# Patient Record
Sex: Male | Born: 1955
Health system: Southern US, Community
[De-identification: ages and names within clinical notes are randomized; demographics above are authoritative.]

## PROBLEM LIST (undated history)

## (undated) DIAGNOSIS — F419 Anxiety disorder, unspecified: Secondary | ICD-10-CM

## (undated) DIAGNOSIS — R112 Nausea with vomiting, unspecified: Secondary | ICD-10-CM

## (undated) DIAGNOSIS — K219 Gastro-esophageal reflux disease without esophagitis: Secondary | ICD-10-CM

## (undated) DIAGNOSIS — N390 Urinary tract infection, site not specified: Secondary | ICD-10-CM

## (undated) DIAGNOSIS — Z9889 Other specified postprocedural states: Secondary | ICD-10-CM

## (undated) DIAGNOSIS — M1732 Unilateral post-traumatic osteoarthritis, left knee: Secondary | ICD-10-CM

## (undated) DIAGNOSIS — I10 Essential (primary) hypertension: Secondary | ICD-10-CM

## (undated) HISTORY — PX: KNEE ARTHROSCOPY: SUR90

## (undated) HISTORY — DX: Essential (primary) hypertension: I10

## (undated) HISTORY — PX: EYE SURGERY: SHX253

## (undated) HISTORY — PX: JOINT REPLACEMENT: SHX530

## (undated) HISTORY — DX: Anxiety disorder, unspecified: F41.9

---

## 2014-07-26 ENCOUNTER — Encounter (HOSPITAL_COMMUNITY): Payer: Self-pay | Admitting: Emergency Medicine

## 2014-07-26 ENCOUNTER — Emergency Department (HOSPITAL_COMMUNITY)
Admission: EM | Admit: 2014-07-26 | Discharge: 2014-07-27 | Disposition: A | Discharge status: Home / Self Care | Payer: Self-pay | Attending: Emergency Medicine | Admitting: Emergency Medicine

## 2014-07-26 DIAGNOSIS — N390 Urinary tract infection, site not specified: Secondary | ICD-10-CM | POA: Insufficient documentation

## 2014-07-26 DIAGNOSIS — R7401 Elevation of levels of liver transaminase levels: Secondary | ICD-10-CM

## 2014-07-26 DIAGNOSIS — R740 Nonspecific elevation of levels of transaminase and lactic acid dehydrogenase [LDH]: Secondary | ICD-10-CM | POA: Insufficient documentation

## 2014-07-26 DIAGNOSIS — R030 Elevated blood-pressure reading, without diagnosis of hypertension: Secondary | ICD-10-CM | POA: Insufficient documentation

## 2014-07-26 DIAGNOSIS — IMO0001 Reserved for inherently not codable concepts without codable children: Secondary | ICD-10-CM

## 2014-07-26 LAB — URINALYSIS, ROUTINE W REFLEX MICROSCOPIC
Bilirubin Urine: NEGATIVE
Glucose, UA: NEGATIVE mg/dL
Hgb urine dipstick: NEGATIVE
Ketones, ur: NEGATIVE mg/dL
Nitrite: NEGATIVE
PROTEIN: NEGATIVE mg/dL
Specific Gravity, Urine: 1.006 (ref 1.005–1.030)
Urobilinogen, UA: 0.2 mg/dL (ref 0.0–1.0)
pH: 5.5 (ref 5.0–8.0)

## 2014-07-26 LAB — URINE MICROSCOPIC-ADD ON

## 2014-07-26 LAB — COMPREHENSIVE METABOLIC PANEL
ALBUMIN: 3.8 g/dL (ref 3.5–5.0)
ALT: 70 U/L — ABNORMAL HIGH (ref 17–63)
ANION GAP: 8 (ref 5–15)
AST: 60 U/L — ABNORMAL HIGH (ref 15–41)
Alkaline Phosphatase: 58 U/L (ref 38–126)
BUN: 10 mg/dL (ref 6–20)
CALCIUM: 9 mg/dL (ref 8.9–10.3)
CHLORIDE: 103 mmol/L (ref 101–111)
CO2: 26 mmol/L (ref 22–32)
CREATININE: 0.9 mg/dL (ref 0.61–1.24)
GFR calc Af Amer: >60 mL/min (ref >60)
Glucose, Bld: 136 mg/dL — ABNORMAL HIGH (ref 65–99)
POTASSIUM: 3.9 mmol/L (ref 3.5–5.1)
SODIUM: 137 mmol/L (ref 135–145)
TOTAL PROTEIN: 6.9 g/dL (ref 6.5–8.1)
Total Bilirubin: 0.7 mg/dL (ref 0.3–1.2)

## 2014-07-26 LAB — CBC WITH DIFFERENTIAL/PLATELET
Basophils Absolute: 0 10*3/uL (ref 0.0–0.1)
Basophils Relative: 1 % (ref 0–1)
EOS ABS: 0.5 10*3/uL (ref 0.0–0.7)
EOS PCT: 8 % — AB (ref 0–5)
HEMATOCRIT: 45.1 % (ref 39.0–52.0)
Hemoglobin: 15.3 g/dL (ref 13.0–17.0)
Lymphocytes Relative: 32 % (ref 12–46)
Lymphs Abs: 2.2 10*3/uL (ref 0.7–4.0)
MCH: 32.9 pg (ref 26.0–34.0)
MCHC: 33.9 g/dL (ref 30.0–36.0)
MCV: 97 fL (ref 78.0–100.0)
Monocytes Absolute: 0.5 10*3/uL (ref 0.1–1.0)
Monocytes Relative: 7 % (ref 3–12)
Neutro Abs: 3.7 10*3/uL (ref 1.7–7.7)
Neutrophils Relative %: 52 % (ref 43–77)
Platelets: 198 10*3/uL (ref 150–400)
RBC: 4.65 MIL/uL (ref 4.22–5.81)
RDW: 13.1 % (ref 11.5–15.5)
WBC: 6.9 10*3/uL (ref 4.0–10.5)

## 2014-07-26 LAB — LIPASE, BLOOD: Lipase: 29 U/L (ref 22–51)

## 2014-07-26 NOTE — ED Provider Notes (Signed)
CSN: [id number]     Arrival date & time 07/26/14  1955 History   None    Chief Complaint  Patient presents with  . Dizziness  . Back Pain     (Consider location/radiation/quality/duration/timing/severity/associated sxs/prior Treatment) HPI   Clayborne DanaDennis Bowmer is a 59 y.o. male no significant past medical history complaining of lightheaded sensation when going from sitting to standing, urinary frequency worsening over the course of 2 months. Patient also reports that he developed a low back pain that is positional, rated at 5 out of 10, no pain medication taken prior to arrival starting 3 days ago. Patient states a friend who is a nurse advised him that he may have a urinary tract infection. He used over-the-counter urinary dipstick and he said it came back positive. He started taking Azo 5 days ago, he completed the course 2-3 days ago. Patient denies dysuria, hematuria, polydipsia , h/o IDVU or cancer, numbness or weakness, difficulty ambulating. On review of systems he notes a tactile fever. Patient denies heavy drinking: States he drinks 2 beers per day but he states that he does drink daily.  Patient denies any testicular pain or swelling, rectal pain or pain with defecation.   History reviewed. No pertinent past medical history. Past Surgical History  Procedure Laterality Date  . Knee surgery     No family history on file. History  Substance Use Topics  . Smoking status: Never Smoker   . Smokeless tobacco: Not on file  . Alcohol Use: Yes    Review of Systems  10 systems reviewed and found to be negative, except as noted in the HPI.   Allergies  Review of patient's allergies indicates no known allergies.  Home Medications   Prior to Admission medications   Medication Sig Start Date End Date Taking? Authorizing Provider  ciprofloxacin (CIPRO) 500 MG tablet Take 1 tablet (500 mg total) by mouth every 12 (twelve) hours. 07/27/14   Nyle Limb, PA-C  oxyCODONE (OXY  IR/ROXICODONE) 5 MG immediate release tablet Take 1 tablet (5 mg total) by mouth every 6 (six) hours as needed for severe pain. 07/27/14   Skyy Nilan, PA-C   BP 141/91 mmHg  Pulse 77  Temp(Src) 98.4 F (36.9 C) (Oral)  Resp 14  Ht 5\' 8"  (1.727 m)  Wt 234 lb (106.142 kg)  BMI 35.59 kg/m2  SpO2 97% Physical Exam  Constitutional: He is oriented to person, place, and time. He appears well-developed and well-nourished. No distress.  HENT:  Head: Normocephalic and atraumatic.  Mouth/Throat: Oropharynx is clear and moist.  Eyes: Conjunctivae and EOM are normal. Pupils are equal, round, and reactive to light.  Neck: Normal range of motion.  Cardiovascular: Normal rate, regular rhythm and intact distal pulses.   Pulmonary/Chest: Effort normal and breath sounds normal.  Abdominal: Soft. There is no tenderness.  Genitourinary:  No CVA tenderness to palpation bilaterally  Musculoskeletal: Normal range of motion.  Neurological: He is alert and oriented to person, place, and time.  Skin: He is not diaphoretic.  Psychiatric: He has a normal mood and affect.  Nursing note and vitals reviewed.   ED Course  Procedures (including critical care time) Labs Review Labs Reviewed  CBC WITH DIFFERENTIAL/PLATELET - Abnormal; Notable for the following:    Eosinophils Relative 8 (*)    All other components within normal limits  COMPREHENSIVE METABOLIC PANEL - Abnormal; Notable for the following:    Glucose, Bld 136 (*)    AST 60 (*)  ALT 70 (*)    All other components within normal limits  URINALYSIS, ROUTINE W REFLEX MICROSCOPIC (NOT AT [facility]) - Abnormal; Notable for the following:    Leukocytes, UA SMALL (*)    All other components within normal limits  URINE MICROSCOPIC-ADD ON - Abnormal; Notable for the following:    Bacteria, UA MANY (*)    All other components within normal limits  URINE CULTURE  LIPASE, BLOOD  HEPATITIS PANEL, ACUTE  HIV ANTIBODY (ROUTINE TESTING)  GC/CHLAMYDIA  PROBE AMP ([place]) NOT AT [facility]    Imaging Review No results found.   EKG Interpretation None      MDM   Final diagnoses:  UTI (lower urinary tract infection)  Transaminitis  Elevated blood pressure    Filed Vitals:   07/26/14 2230 07/26/14 2300 07/26/14 2330 07/26/14 2345  BP: 142/78 132/81 143/88 141/91  Pulse: 70 71 76 77  Temp:      TempSrc:      Resp: Height:      Weight:      SpO2: 97% 95% 95% 97%    Medications  lidocaine (PF) (XYLOCAINE) 1 % injection (not administered)  cefTRIAXone (ROCEPHIN) injection 250 mg (250 mg Intramuscular Given 07/27/14 0033)  azithromycin (ZITHROMAX) tablet 1,000 mg (1,000 mg Oral Given 07/27/14 0032)  ciprofloxacin (CIPRO) tablet 500 mg (500 mg Oral Given 07/27/14 0032)    Tracie Lindbloom is a pleasant 59 y.o. male presenting with white headed sensation when going from sitting to standing urinary frequency over the course of 2 months. Patient developed low back pain 2 days ago. Patient is afebrile, no tachycardia, not hypotensive. Blood pressure is elevated in the ED. He does not have primary care follow-up, has not been diagnosed with hypertension does not take any medication regularly. Urinalysis is consistent with infection, no signs of pyelonephritis, epididymitis, prostatitis. Patient states he had unprotected sex 6 months ago. We'll cover for gonorrhea and Chlamydia will treat a complicated UTI with 2 weeks of Cipro. Patient has a transaminitis, hepatitis panel was sent, he understands that I will not follow-up the results to this, he will have to present to the wellness Center to follow-up the results or he can request recordsto follow her up the results. He verbalizes his understanding.  Evaluation does not show pathology that would require ongoing emergent intervention or inpatient treatment. Pt is hemodynamically stable and mentating appropriately. Discussed findings and plan with patient/guardian, who agrees with care  plan. All questions answered. Return precautions discussed and outpatient follow up given.   New Prescriptions   CIPROFLOXACIN (CIPRO) 500 MG TABLET    Take 1 tablet (500 mg total) by mouth every 12 (twelve) hours.   OXYCODONE (OXY IR/ROXICODONE) 5 MG IMMEDIATE RELEASE TABLET    Take 1 tablet (5 mg total) by mouth every 6 (six) hours as needed for severe pain.         Wynetta Emery, PA-C 07/27/14 0131  Mancel Bale, MD 07/28/14 [phone]

## 2014-07-26 NOTE — ED Notes (Signed)
Pt. reports dizziness for 2 months and low back pain onset 3 days ago , denies injury , alert and oriented / respirations unlabored .

## 2014-07-27 LAB — GC/CHLAMYDIA PROBE AMP (~~LOC~~) NOT AT ARMC
Chlamydia: NEGATIVE
NEISSERIA GONORRHEA: NEGATIVE

## 2014-07-27 LAB — HIV ANTIBODY (ROUTINE TESTING W REFLEX): HIV SCREEN 4TH GENERATION: NONREACTIVE

## 2014-07-27 MED ORDER — LIDOCAINE HCL (PF) 1 % IJ SOLN
INTRAMUSCULAR | Status: AC
  Filled 2014-07-27: qty 5

## 2014-07-27 MED ORDER — CEFTRIAXONE SODIUM 250 MG IJ SOLR
250.0000 mg | Freq: Once | INTRAMUSCULAR | Status: AC
  Administered 2014-07-27: 250 mg via INTRAMUSCULAR
  Filled 2014-07-27: qty 250

## 2014-07-27 MED ORDER — AZITHROMYCIN 250 MG PO TABS
1000.0000 mg | ORAL_TABLET | Freq: Once | ORAL | Status: AC
  Administered 2014-07-27: 1000 mg via ORAL
  Filled 2014-07-27: qty 4

## 2014-07-27 MED ORDER — CIPROFLOXACIN HCL 500 MG PO TABS: 500.0000 mg | ORAL_TABLET | Freq: Two times a day (BID) | ORAL | Status: DC

## 2014-07-27 MED ORDER — CIPROFLOXACIN HCL 500 MG PO TABS
500.0000 mg | ORAL_TABLET | Freq: Once | ORAL | Status: AC
  Administered 2014-07-27: 500 mg via ORAL
  Filled 2014-07-27: qty 1

## 2014-07-27 MED ORDER — OXYCODONE HCL 5 MG PO TABS: 5.0000 mg | ORAL_TABLET | Freq: Four times a day (QID) | ORAL | Status: DC | PRN

## 2014-07-27 NOTE — Discharge Instructions (Signed)
Take oxycodone for breakthrough pain, do not drink alcohol, drive, care for children or do other critical tasks while taking oxycodone.  Take your antibiotics as directed and to completion. You should never have any leftover antibiotics! Push fluids and stay well hydrated.   Do not hesitate to return to the Emergency Department for any new, worsening or concerning symptoms.   If you do not have a primary care doctor you can establish one at the   [facility]: [address] [place] [id number] [phone]  After you establish care. Let them know you were seen in the emergency room. They must obtain records for further management.

## 2014-07-28 LAB — HEPATITIS PANEL, ACUTE
HCV Ab: <0.1 s/co ratio — AB (ref 0.0–0.9)
HEP A IGM: NEGATIVE — AB
HEP B S AG: NEGATIVE — AB
Hep B C IgM: NEGATIVE — AB

## 2014-07-29 LAB — URINE CULTURE: Colony Count: >=100000

## 2014-07-30 ENCOUNTER — Telehealth (HOSPITAL_COMMUNITY): Payer: Self-pay

## 2014-07-30 NOTE — Telephone Encounter (Signed)
Post ED Visit - Positive Culture Follow-up  Culture report reviewed by antimicrobial stewardship pharmacist: []  Cody Diaz, Pharm.D., BCPS []  Cody MiyamotoJeremy Diaz, Pharm.D., BCPS []  Cody PillionElizabeth Diaz, Pharm.D., BCPS []  [name], [address]Ph[address]D., BCPS, AAHIVP [x]  Cody HuskMichelle Diaz, Pharm.D., BCPS, AAHIVP []  Cody CyphersLorie Diaz, [address]Pharm[address]D., BCPS  Positive Urine culture, >/= 100,000 colonies -> Klebsiella [name], [name], Cody Diaz 07/30/2014, 7:28 PM

## 2014-08-10 ENCOUNTER — Ambulatory Visit: Payer: Self-pay | Attending: Family Medicine | Admitting: Family Medicine

## 2014-08-10 ENCOUNTER — Encounter: Payer: Self-pay | Admitting: Family Medicine

## 2014-08-10 VITALS — BP 130/89 | HR 69 | Temp 98.2°F | Resp 16 | Ht 68.0 in | Wt 234.0 lb

## 2014-08-10 DIAGNOSIS — R42 Dizziness and giddiness: Secondary | ICD-10-CM

## 2014-08-10 DIAGNOSIS — B961 Klebsiella pneumoniae [K. pneumoniae] as the cause of diseases classified elsewhere: Secondary | ICD-10-CM

## 2014-08-10 DIAGNOSIS — M545 Low back pain, unspecified: Secondary | ICD-10-CM

## 2014-08-10 DIAGNOSIS — R739 Hyperglycemia, unspecified: Secondary | ICD-10-CM

## 2014-08-10 DIAGNOSIS — A498 Other bacterial infections of unspecified site: Secondary | ICD-10-CM | POA: Insufficient documentation

## 2014-08-10 DIAGNOSIS — R300 Dysuria: Secondary | ICD-10-CM

## 2014-08-10 LAB — POCT URINALYSIS DIPSTICK
Bilirubin, UA: NEGATIVE
Glucose, UA: NEGATIVE
KETONES UA: NEGATIVE
Leukocytes, UA: NEGATIVE
Nitrite, UA: NEGATIVE
PROTEIN UA: NEGATIVE
RBC UA: NEGATIVE
SPEC GRAV UA: 1.01
Urobilinogen, UA: 0.2
pH, UA: 6

## 2014-08-10 LAB — POCT GLYCOSYLATED HEMOGLOBIN (HGB A1C): Hemoglobin A1C: 5.9

## 2014-08-10 LAB — GLUCOSE, POCT (MANUAL RESULT ENTRY): POC GLUCOSE: 107 mg/dL — AB (ref 70–99)

## 2014-08-10 MED ORDER — CYCLOBENZAPRINE HCL 10 MG PO TABS: 10.0000 mg | ORAL_TABLET | Freq: Three times a day (TID) | ORAL | Status: DC | PRN

## 2014-08-10 NOTE — Progress Notes (Signed)
   Subjective:    Patient ID: Cody Diaz, male    DOB: [birth year], 59 y.o.   MRN: [id number] CC: ED f/u UTI  HPI  1. UTI: patient with klebsiella UTI. Went ot ED for b/l low back pain. Taking cipro. No fever. Has some chills. Some nausea. No emesis. Stopped percocet due to nausea. Circumcised.   2. Dizziness: x 2 months with standing. Has fatigue. No CP or SOB. Unemployed. Does not exercise. Gets some numbness in fingers. Drinks 2 beers nightly.  Soc hx: non smoker Med Hx: negative for asthma fam hx: DM2 in mother    Review of Systems  Constitutional: Positive for chills and fatigue. Negative for fever.  Respiratory: Negative for shortness of breath.   Cardiovascular: Negative for chest pain, palpitations and leg swelling.  Gastrointestinal: Positive for nausea. Negative for vomiting and abdominal pain.  Endocrine: Negative for polydipsia, polyphagia and polyuria.  Genitourinary: Negative for dysuria, urgency and frequency.  Musculoskeletal: Positive for back pain.  Neurological: Positive for dizziness, light-headedness and numbness. Negative for tremors, seizures, syncope, facial asymmetry, speech difficulty, weakness and headaches.  Psychiatric/Behavioral: Positive for sleep disturbance.       Objective:   Physical Exam BP 130/89 mmHg  Pulse 69  Temp(Src) 98.2 F (36.8 C) (Oral)  Resp 16  Ht 5\' 8"  (1.727 m)  Wt 234 lb (106.142 kg)  BMI 35.59 kg/m2  SpO2 94% General appearance: alert, cooperative, no distress and moderately obese Back: symmetric, no curvature. ROM normal. No CVA tenderness. b/l lumbar paraspinal muscle tenderness.  Lungs: normal WOB  Skin: Skin color, texture, turgor normal. No rashes or lesions       Assessment & Plan:

## 2014-08-10 NOTE — Patient Instructions (Addendum)
Cody Diaz,  Thank you for coming in today   1. Klebsiella UTI still with low back pains: Finish cipro Add muscle relaxer, flexeril  Repeat UA is normal Sending out repeat urine culture   2. Dizziness:  Checking blood sugar Continue to drink plenty of water    Chemistry      Component Value Date/Time   NA 137 07/26/2014 2009   K 3.9 07/26/2014 2009   CL 103 07/26/2014 2009   CO2 26 07/26/2014 2009   BUN 10 07/26/2014 2009   CREATININE 0.90 07/26/2014 2009      Component Value Date/Time   CALCIUM 9.0 07/26/2014 2009   ALKPHOS 58 07/26/2014 2009   AST 60* 07/26/2014 2009   ALT 70* 07/26/2014 2009   BILITOT 0.7 07/26/2014 2009     F/u in 6 weeks for dizziness  Dr. Armen Pickup

## 2014-08-10 NOTE — Assessment & Plan Note (Signed)
A; low back pain suspect MSK pain P: muscle relaxer

## 2014-08-10 NOTE — Assessment & Plan Note (Signed)
A; klebsiella UTI sensitive to cipro. Repeat UA wnl today P: Repeat urine culture Pt to finish cipro Plan for renal US once patient has orange card

## 2014-08-10 NOTE — Assessment & Plan Note (Signed)
A: noted in ED. Mom with DM2. A1c elevated but not diabetes P: Encouraged exercise and low carb diet

## 2014-08-10 NOTE — Assessment & Plan Note (Signed)
A; sound like ortho stasis P: Pateint to f/u for orthostatic VS at next OV Increase fluids today

## 2014-08-10 NOTE — Progress Notes (Signed)
Establish care  HFU UTI

## 2014-08-11 LAB — URINE CULTURE
Colony Count: NO GROWTH
ORGANISM ID, BACTERIA: NO GROWTH

## 2014-08-15 ENCOUNTER — Telehealth: Payer: Self-pay | Admitting: *Deleted

## 2014-08-15 NOTE — Telephone Encounter (Signed)
Pt aware of results 

## 2014-08-15 NOTE — Telephone Encounter (Signed)
-----   Message from Dessa Phi, MD sent at 08/14/2014  8:56 AM EDT ----- Repeat urine culture negative

## 2015-05-01 ENCOUNTER — Encounter: Payer: Self-pay | Admitting: Family Medicine

## 2015-05-01 ENCOUNTER — Ambulatory Visit: Payer: Self-pay | Attending: Family Medicine | Admitting: Family Medicine

## 2015-05-01 ENCOUNTER — Encounter: Payer: Self-pay | Admitting: Clinical

## 2015-05-01 VITALS — BP 145/85 | HR 72 | Temp 98.0°F | Resp 16 | Ht 67.5 in | Wt 246.0 lb

## 2015-05-01 DIAGNOSIS — R42 Dizziness and giddiness: Secondary | ICD-10-CM

## 2015-05-01 DIAGNOSIS — M545 Low back pain, unspecified: Secondary | ICD-10-CM

## 2015-05-01 DIAGNOSIS — Z79899 Other long term (current) drug therapy: Secondary | ICD-10-CM | POA: Insufficient documentation

## 2015-05-01 DIAGNOSIS — Z636 Dependent relative needing care at home: Secondary | ICD-10-CM

## 2015-05-01 DIAGNOSIS — R7303 Prediabetes: Secondary | ICD-10-CM

## 2015-05-01 DIAGNOSIS — N390 Urinary tract infection, site not specified: Secondary | ICD-10-CM

## 2015-05-01 DIAGNOSIS — K0530 Chronic periodontitis, unspecified: Secondary | ICD-10-CM

## 2015-05-01 DIAGNOSIS — E559 Vitamin D deficiency, unspecified: Secondary | ICD-10-CM

## 2015-05-01 DIAGNOSIS — I10 Essential (primary) hypertension: Secondary | ICD-10-CM

## 2015-05-01 DIAGNOSIS — M549 Dorsalgia, unspecified: Secondary | ICD-10-CM | POA: Insufficient documentation

## 2015-05-01 DIAGNOSIS — F411 Generalized anxiety disorder: Secondary | ICD-10-CM

## 2015-05-01 LAB — COMPLETE METABOLIC PANEL WITH GFR
ALT: 107 U/L — ABNORMAL HIGH (ref 9–46)
AST: 89 U/L — AB (ref 10–35)
Albumin: 4.5 g/dL (ref 3.6–5.1)
Alkaline Phosphatase: 70 U/L (ref 40–115)
BUN: 14 mg/dL (ref 7–25)
CALCIUM: 9.6 mg/dL (ref 8.6–10.3)
CHLORIDE: 100 mmol/L (ref 98–110)
CO2: 25 mmol/L (ref 20–31)
Creat: 0.92 mg/dL (ref 0.70–1.33)
GFR, Est African American: >89 mL/min (ref >=60)
GFR, Est Non African American: >89 mL/min (ref >=60)
GLUCOSE: 105 mg/dL — AB (ref 65–99)
POTASSIUM: 4.4 mmol/L (ref 3.5–5.3)
SODIUM: 140 mmol/L (ref 135–146)
Total Bilirubin: 0.8 mg/dL (ref 0.2–1.2)
Total Protein: 7.8 g/dL (ref 6.1–8.1)

## 2015-05-01 LAB — POCT URINALYSIS DIPSTICK
BILIRUBIN UA: NEGATIVE
Glucose, UA: NEGATIVE
Ketones, UA: NEGATIVE
NITRITE UA: NEGATIVE
PH UA: 5.5
Protein, UA: NEGATIVE
RBC UA: NEGATIVE
Spec Grav, UA: 1.015
UROBILINOGEN UA: 0.2

## 2015-05-01 LAB — CBC
HCT: 45.9 % (ref 39.0–52.0)
Hemoglobin: 16.1 g/dL (ref 13.0–17.0)
MCH: 33.8 pg (ref 26.0–34.0)
MCHC: 35.1 g/dL (ref 30.0–36.0)
MCV: 96.2 fL (ref 78.0–100.0)
MPV: 10.5 fL (ref 8.6–12.4)
PLATELETS: 176 10*3/uL (ref 150–400)
RBC: 4.77 MIL/uL (ref 4.22–5.81)
RDW: 13.5 % (ref 11.5–15.5)
WBC: 8 10*3/uL (ref 4.0–10.5)

## 2015-05-01 LAB — GLUCOSE, POCT (MANUAL RESULT ENTRY): POC GLUCOSE: 112 mg/dL — AB (ref 70–99)

## 2015-05-01 LAB — TSH: TSH: 1.99 mIU/L (ref 0.40–4.50)

## 2015-05-01 LAB — POCT GLYCOSYLATED HEMOGLOBIN (HGB A1C): HEMOGLOBIN A1C: 6.4

## 2015-05-01 MED ORDER — DIAZEPAM 5 MG PO TABS: 5.0000 mg | ORAL_TABLET | Freq: Four times a day (QID) | ORAL | Status: DC | PRN

## 2015-05-01 MED ORDER — ESCITALOPRAM OXALATE 10 MG PO TABS: 10.0000 mg | ORAL_TABLET | Freq: Every day | ORAL | Status: DC

## 2015-05-01 MED ORDER — CHLORHEXIDINE GLUCONATE 0.12 % MT SOLN: 15.0000 mL | Freq: Two times a day (BID) | OROMUCOSAL | Status: DC

## 2015-05-01 MED ORDER — CIPROFLOXACIN HCL 500 MG PO TABS: 500.0000 mg | ORAL_TABLET | Freq: Two times a day (BID) | ORAL | Status: DC

## 2015-05-01 MED FILL — CHLORHEXIDINE 0.12% RINSE: 0.12 | 30 days supply | Qty: 473 | Fill #0

## 2015-05-01 MED FILL — diazePAM 5 MG TABS: 5 | 15 days supply | Qty: 60 | Fill #0

## 2015-05-01 MED FILL — ?CIPROFLOXACIN HCL 500MG TA: 500 | 7 days supply | Qty: 14 | Fill #0

## 2015-05-01 MED FILL — ?ESCITALOPRAM 10 MG TABLET: 10 | 30 days supply | Qty: 30 | Fill #0

## 2015-05-01 NOTE — Assessment & Plan Note (Signed)
A: prediabetes  P: Plan to start metformin at f.u

## 2015-05-01 NOTE — Assessment & Plan Note (Signed)
A; GAD with caregiver stress P: lexapro Valium

## 2015-05-01 NOTE — Assessment & Plan Note (Signed)
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

## 2015-05-01 NOTE — Assessment & Plan Note (Signed)
A: UA suggest UTI P: cipro Urine cx

## 2015-05-01 NOTE — Progress Notes (Signed)
Low back pain frequency urination x 3 month Anxiety due to mother been sick  Pain today #8 No tobacco user  No suicidal thoughts in the past two weeks

## 2015-05-01 NOTE — Assessment & Plan Note (Signed)
A; chronic low back pain P: Valium muscle relaxer

## 2015-05-01 NOTE — Progress Notes (Signed)
[facility] 2/9 05/01/2015 08/10/2014  Decreased Interest 3 0  Down, Depressed, Hopeless 3 0  PHQ - 2 Score 6 0  Altered sleeping 0 -  Tired, decreased energy 0 -  Change in appetite 2 -  Feeling bad or failure about yourself  0 -  Trouble concentrating 3 -  Moving slowly or fidgety/restless 3 -  Suicidal thoughts 0 -  PHQ-9 Score 14 -    GAD 7 : Generalized Anxiety Score 05/01/2015  Nervous, Anxious, on Edge 3  Control/stop worrying 3  Worry too much - different things 3  Trouble relaxing 3  Restless 3  Easily annoyed or irritable 3  Afraid - awful might happen 3  Total GAD 7 Score 21

## 2015-05-01 NOTE — Assessment & Plan Note (Signed)
A: elevated BP with hx of labile BPs P: Close f/u Likely start ARB

## 2015-05-01 NOTE — Progress Notes (Signed)
Subjective:  Patient ID: Cody Diaz, male    DOB: September 11, 1955  Age: 60 y.o. MRN: [id number]  CC: Back Pain and Anxiety   HPI Cody Diaz presents for   1. Back pain: x one year. Bilateral low back. Non-radiating. Worse on L side. He also endorses 3 months of urinary frequency. No hematuria, no dysuria. No recent trauma. No fever or chills.   2. Anxiety: x 6 months. 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 He lives with his mom. He is unemployed. His sister comes over to help 2-3 times a week. His mom refuses home health/SNF. Once while he was working, his mother fell at home and was down for 3 hrs. After that he quit his job.  He has daily anxiety. He has poor sleep, tremor, dizziness, lightheadedness, crying. He has a hx of alcohol overuse. He last drank about 3 weeks in an attempt to numb a severe tooth ache.   3. Poor dentition: he has multiples carries with cracked and broken molars. He denies tooth pain now but had some recently. He has not applied for the orange card/[place] discount due to worries [place].   Social History  Substance Use Topics  . Smoking status: Never Smoker   . Smokeless tobacco: Never Used  . Alcohol Use: 1.2 oz/week    2 Cans of beer per week     Comment: 2 beers per night    Outpatient Prescriptions Prior to Visit  Medication Sig Dispense Refill  . cyclobenzaprine (FLEXERIL) 10 MG tablet Take 1 tablet (10 mg total) by mouth 3 (three) times daily as needed for muscle spasms. (Patient not taking: Reported on 05/01/2015) 30 tablet 0  . ciprofloxacin (CIPRO) 500 MG tablet Take 1 tablet (500 mg total) by mouth every 12 (twelve) hours. 28 tablet 0   No facility-administered medications prior to visit.    ROS Review of Systems  Constitutional: Negative for fever, chills, fatigue and unexpected weight change.  Eyes: Negative for visual disturbance.    Respiratory: Negative for cough and shortness of breath.   Cardiovascular: Negative for chest pain, palpitations and leg swelling.  Gastrointestinal: Negative for nausea, vomiting, abdominal pain, diarrhea, constipation and blood in stool.  Endocrine: Negative for polydipsia, polyphagia and polyuria.  Musculoskeletal: Positive for back pain. Negative for myalgias, arthralgias, gait problem and neck pain.  Skin: Negative for rash.  Allergic/Immunologic: Negative for immunocompromised state.  Neurological: Positive for dizziness, light-headedness and numbness (in 3-5th fingers on R hand ).  Hematological: Negative for adenopathy. Does not bruise/bleed easily.  Psychiatric/Behavioral: Positive for sleep disturbance. Negative for suicidal ideas and dysphoric mood. The patient is nervous/anxious.     Objective:  BP 145/85 mmHg  Pulse 72  Temp(Src) 98 F (36.7 C) (Oral)  Resp 16  Ht 5' 7.5" (1.715 m)  Wt 246 lb (111.585 kg)  BMI 37.94 kg/m2  SpO2 96%  BP/Weight 05/01/2015 08/10/2014 07/27/2014  Systolic BP 145 130 159  Diastolic BP 85 89 92  Wt. (Lbs) 246 234 -  BMI 37.94 35.59 -    Physical Exam  Constitutional: He appears well-developed and well-nourished. No distress.  HENT:  Head: Normocephalic and atraumatic.  Mouth/Throat: Oropharynx is clear and moist and mucous membranes are normal. Abnormal dentition. Dental caries present.  Neck: Normal range of motion. Neck supple.  Cardiovascular: Normal rate, regular rhythm, normal heart sounds and intact distal pulses.  Pulmonary/Chest: Effort normal and breath sounds normal.  Musculoskeletal: He exhibits no edema.       Lumbar back: He exhibits decreased range of motion, tenderness, pain and spasm. He exhibits no bony tenderness.  B/l lumbar paraspinal muscle tenderness   Neurological: He is alert.  Skin: Skin is warm and dry. No rash noted. No erythema.  Psychiatric: He has a normal mood and affect.   GAD 7 : Generalized Anxiety  Score 05/01/2015  Nervous, Anxious, on Edge 3  Control/stop worrying 3  Worry too much - different things 3  Trouble relaxing 3  Restless 3  Easily annoyed or irritable 3  Afraid - awful might happen 3  Total GAD 7 Score 21    Depression screen [facility] 2/9 05/01/2015 08/10/2014  Decreased Interest 3 0  Down, Depressed, Hopeless 3 0  PHQ - 2 Score 6 0  Altered sleeping 0 -  Tired, decreased energy 0 -  Change in appetite 2 -  Feeling bad or failure about yourself  0 -  Trouble concentrating 3 -  Moving slowly or fidgety/restless 3 -  Suicidal thoughts 0 -  PHQ-9 Score 14 -   UA: large LE, otherwise negative  CBG: 112  Lab Results  Component Value Date   HGBA1C 5.90 08/10/2014   Lab Results  Component Value Date   HGBA1C 6.4 05/01/2015     Assessment & Plan:   Cody Diaz was seen today for back pain and anxiety.  Diagnoses and all orders for this visit:  Lightheadedness -     CBC -     COMPLETE METABOLIC PANEL WITH GFR -     Vitamin D, 25-hydroxy -     TSH -     Glucose (CBG) -     HgB A1c  Bilateral low back pain without sciatica -     POCT urinalysis dipstick -     diazepam (VALIUM) 5 MG tablet; Take 1 tablet (5 mg total) by mouth every 6 (six) hours as needed for anxiety.  Essential hypertension  Prediabetes  Generalized anxiety disorder -     escitalopram (LEXAPRO) 10 MG tablet; Take 1 tablet (10 mg total) by mouth daily. -     diazepam (VALIUM) 5 MG tablet; Take 1 tablet (5 mg total) by mouth every 6 (six) hours as needed for anxiety.  Caregiver stress  Urinary tract infection, site not specified -     Urine culture -     ciprofloxacin (CIPRO) 500 MG tablet; Take 1 tablet (500 mg total) by mouth 2 (two) times daily.  Chronic periodontitis -     Discontinue: chlorhexidine (PERIDEX) 0.12 % solution; Use as directed 15 mLs in the mouth or throat 2 (two) times daily. Swish for 30 seconds then spit twice daily after brushing -     chlorhexidine (PERIDEX) 0.12 %  solution; Use as directed 15 mLs in the mouth or throat 2 (two) times daily. Swish for 30 seconds then spit twice daily after brushing    No orders of the defined types were placed in this encounter.    Follow-up: No Follow-up on file.   Dessa Phi MD

## 2015-05-01 NOTE — Patient Instructions (Addendum)
Cody Diaz was seen today for back pain and anxiety.  Diagnoses and all orders for this visit:  Lightheadedness -     CBC -     COMPLETE METABOLIC PANEL WITH GFR -     Vitamin D, 25-hydroxy -     TSH -     Glucose (CBG) -     HgB A1c  Bilateral low back pain without sciatica -     POCT urinalysis dipstick -     diazepam (VALIUM) 5 MG tablet; Take 1 tablet (5 mg total) by mouth every 6 (six) hours as needed for anxiety.  Essential hypertension  Prediabetes  Generalized anxiety disorder -     escitalopram (LEXAPRO) 10 MG tablet; Take 1 tablet (10 mg total) by mouth daily. -     diazepam (VALIUM) 5 MG tablet; Take 1 tablet (5 mg total) by mouth every 6 (six) hours as needed for anxiety.  Caregiver stress  Urinary tract infection, site not specified -     Urine culture -     ciprofloxacin (CIPRO) 500 MG tablet; Take 1 tablet (500 mg total) by mouth 2 (two) times daily.  Chronic periodontitis -     chlorhexidine (PERIDEX) 0.12 % solution; Use as directed 15 mLs in the mouth or throat 2 (two) times daily. Swish for 30 seconds then spit twice daily after brushing   F/u in 4 weeks for blood sugar check and BP check  Dr. Armen PickupFunches

## 2015-05-02 ENCOUNTER — Telehealth: Payer: Self-pay | Admitting: *Deleted

## 2015-05-02 DIAGNOSIS — E559 Vitamin D deficiency, unspecified: Secondary | ICD-10-CM | POA: Insufficient documentation

## 2015-05-02 LAB — VITAMIN D 25 HYDROXY (VIT D DEFICIENCY, FRACTURES): Vit D, 25-Hydroxy: 5 ng/mL — ABNORMAL LOW (ref 30–100)

## 2015-05-02 MED ORDER — VITAMIN D (ERGOCALCIFEROL) 1.25 MG (50000 UNIT) PO CAPS: 50000.0000 [IU] | ORAL_CAPSULE | ORAL | Status: DC

## 2015-05-02 NOTE — Telephone Encounter (Signed)
Date of birth verified by pt  Lab results given Rx send to [facility] pharmacy  Avoid ETOH  Pt verbalized understanding

## 2015-05-02 NOTE — Telephone Encounter (Signed)
-----   Message from Dessa PhiJosalyn Funches, MD sent at 05/02/2015  2:50 PM EST ----- Labs normal except for elevated liver enzymes and vit D deficiency Vit D ordered  Avoid alcohol

## 2015-05-02 NOTE — Addendum Note (Signed)
Addended by: Dessa PhiFUNCHES, Parth Mccormac on: 05/02/2015 02:51 PM   Modules accepted: Orders

## 2015-05-29 ENCOUNTER — Ambulatory Visit: Payer: Self-pay | Admitting: Family Medicine

## 2015-06-29 ENCOUNTER — Ambulatory Visit: Payer: Self-pay | Admitting: Family Medicine

## 2015-07-16 ENCOUNTER — Ambulatory Visit: Payer: Self-pay | Admitting: Family Medicine

## 2015-07-26 ENCOUNTER — Encounter: Payer: Self-pay | Admitting: Family Medicine

## 2015-07-26 ENCOUNTER — Ambulatory Visit: Payer: Self-pay | Attending: Family Medicine | Admitting: Family Medicine

## 2015-07-26 VITALS — BP 129/91 | HR 90 | Temp 98.2°F | Resp 16 | Ht 68.0 in | Wt 254.0 lb

## 2015-07-26 DIAGNOSIS — F411 Generalized anxiety disorder: Secondary | ICD-10-CM

## 2015-07-26 DIAGNOSIS — M545 Low back pain, unspecified: Secondary | ICD-10-CM

## 2015-07-26 DIAGNOSIS — Z79899 Other long term (current) drug therapy: Secondary | ICD-10-CM | POA: Insufficient documentation

## 2015-07-26 DIAGNOSIS — M25562 Pain in left knee: Secondary | ICD-10-CM

## 2015-07-26 DIAGNOSIS — E119 Type 2 diabetes mellitus without complications: Secondary | ICD-10-CM

## 2015-07-26 LAB — POCT GLYCOSYLATED HEMOGLOBIN (HGB A1C): HEMOGLOBIN A1C: 6.5

## 2015-07-26 MED ORDER — NAPROXEN 500 MG PO TABS: 500.0000 mg | ORAL_TABLET | Freq: Two times a day (BID) | ORAL | Status: DC

## 2015-07-26 MED ORDER — METFORMIN HCL ER 500 MG PO TB24: 1000.0000 mg | ORAL_TABLET | Freq: Every day | ORAL | Status: DC

## 2015-07-26 MED ORDER — ESCITALOPRAM OXALATE 10 MG PO TABS: 10.0000 mg | ORAL_TABLET | Freq: Every day | ORAL | Status: DC

## 2015-07-26 MED ORDER — DIAZEPAM 5 MG PO TABS: 5.0000 mg | ORAL_TABLET | Freq: Three times a day (TID) | ORAL | Status: DC | PRN

## 2015-07-26 NOTE — Patient Instructions (Addendum)
[name] was seen today for diabetes.  Diagnoses and all orders for this visit:  Lateral knee pain, left -     naproxen (NAPROSYN) 500 MG tablet; Take 1 tablet (500 mg total) by mouth 2 (two) times daily with a meal. -     DG Knee AP/LAT W/Sunrise Left; Future  Generalized anxiety disorder -     escitalopram (LEXAPRO) 10 MG tablet; Take 1 tablet (10 mg total) by mouth daily. -     diazepam (VALIUM) 5 MG tablet; Take 1 tablet (5 mg total) by mouth every 8 (eight) hours as needed for anxiety.  Bilateral low back pain without sciatica -     diazepam (VALIUM) 5 MG tablet; Take 1 tablet (5 mg total) by mouth every 8 (eight) hours as needed for anxiety.  New onset type 2 diabetes mellitus (HCC) -     HgB A1c -     metFORMIN (GLUCOPHAGE XR) 500 MG 24 hr tablet; Take 2 tablets (1,000 mg total) by mouth daily after supper.   For the first week, take 500 mg of metformin XR after supper, then increase to 1000 mg after supper.    F/u in 6 weeks for diabetes and L knee pain   Dr. Armen PickupFunches

## 2015-07-26 NOTE — Progress Notes (Signed)
Subjective:  Patient ID: Cody Diaz, male    DOB: 1955/08/09  Age: 60 y.o. MRN: [id number]  CC: Diabetes   HPI Cody Diaz presents for   1. Diabetes: he was pre-diabetic at last OV. He drinks vodka nightly due to stress. He cares for his elderly mother. He does not exercise. He is gaining weight.  2. Stress: daily. Trouble sleep. Not taking lexpro or valium as he ran out of both. No SI. Unemployed.  3. L knee pain: x 3 weeks. Fell onto his L knee while attempting to brace his mother's fall. Lateral pain. Mild swelling. No redness. Has hx of R knee injury s/p scope.  Social History  Substance Use Topics  . Smoking status: Never Smoker   . Smokeless tobacco: Never Used  . Alcohol Use: 1.2 oz/week    2 Cans of beer per week     Comment: 2 beers per night     Outpatient Prescriptions Prior to Visit  Medication Sig Dispense Refill  . chlorhexidine (PERIDEX) 0.12 % solution Use as directed 15 mLs in the mouth or throat 2 (two) times daily. Swish for 30 seconds then spit twice daily after brushing 473 mL 0  . ciprofloxacin (CIPRO) 500 MG tablet Take 1 tablet (500 mg total) by mouth 2 (two) times daily. 14 tablet 0  . cyclobenzaprine (FLEXERIL) 10 MG tablet Take 1 tablet (10 mg total) by mouth 3 (three) times daily as needed for muscle spasms. (Patient not taking: Reported on 05/01/2015) 30 tablet 0  . diazepam (VALIUM) 5 MG tablet Take 1 tablet (5 mg total) by mouth every 6 (six) hours as needed for anxiety. 60 tablet 0  . escitalopram (LEXAPRO) 10 MG tablet Take 1 tablet (10 mg total) by mouth daily. 30 tablet 0  . Vitamin D, Ergocalciferol, (DRISDOL) 50000 units CAPS capsule Take 1 capsule (50,000 Units total) by mouth every 7 (seven) days. For 8 weeks 8 capsule 0   No facility-administered medications prior to visit.    ROS Review of Systems  Constitutional: Negative for fever, chills, fatigue and unexpected weight change.  Eyes: Negative for visual disturbance.    Respiratory: Negative for cough and shortness of breath.   Cardiovascular: Negative for chest pain, palpitations and leg swelling.  Gastrointestinal: Negative for nausea, vomiting, abdominal pain, diarrhea, constipation and blood in stool.  Endocrine: Negative for polydipsia, polyphagia and polyuria.  Musculoskeletal: Positive for arthralgias. Negative for myalgias, back pain, gait problem and neck pain.  Skin: Negative for rash.  Allergic/Immunologic: Negative for immunocompromised state.  Hematological: Negative for adenopathy. Does not bruise/bleed easily.  Psychiatric/Behavioral: Positive for sleep disturbance. Negative for suicidal ideas and dysphoric mood. The patient is nervous/anxious.     Objective:  BP 129/91 mmHg  Pulse 90  Temp(Src) 98.2 F (36.8 C) (Oral)  Resp 16  Ht 5\' 8"  (1.727 m)  Wt 254 lb (115.214 kg)  BMI 38.63 kg/m2  SpO2 96%  BP/Weight 07/26/2015 05/01/2015 08/10/2014  Systolic BP 129 145 130  Diastolic BP 91 85 89  Wt. (Lbs) 254 246 234  BMI 38.63 37.94 35.59    Physical Exam  Constitutional: He appears well-developed and well-nourished. No distress.  Obese   HENT:  Head: Normocephalic and atraumatic.  Neck: Normal range of motion. Neck supple.  Cardiovascular: Normal rate, regular rhythm, normal heart sounds and intact distal pulses.   Pulmonary/Chest: Effort normal and breath sounds normal.  Musculoskeletal: He exhibits no edema.       Left knee: He  exhibits decreased range of motion, swelling, effusion and abnormal patellar mobility. He exhibits no ecchymosis, no deformity, no laceration, no erythema, normal alignment and no LCL laxity. Tenderness found. Lateral joint line tenderness noted.  Neurological: He is alert.  Skin: Skin is warm and dry. No rash noted. No erythema.  Psychiatric: He has a normal mood and affect.    Lab Results  Component Value Date   HGBA1C 6.4 05/01/2015    Lab Results  Component Value Date   HGBA1C 6.5 07/26/2015    Depression screen Cody Diaz 2/9 07/26/2015 05/01/2015 08/10/2014  Decreased Interest 3 3 0  Down, Depressed, Hopeless 2 3 0  PHQ - 2 Score 5 6 0  Altered sleeping 3 0 -  Tired, decreased energy 3 0 -  Change in appetite 3 2 -  Feeling bad or failure about yourself  3 0 -  Trouble concentrating 1 3 -  Moving slowly or fidgety/restless 3 3 -  Suicidal thoughts 1 0 -  PHQ-9 Score 22 14 -    GAD 7 : Generalized Anxiety Score 07/26/2015 05/01/2015  Nervous, Anxious, on Edge 3 3  Control/stop worrying 3 3  Worry too much - different things 3 3  Trouble relaxing 3 3  Restless 3 3  Easily annoyed or irritable 2 3  Afraid - awful might happen 3 3  Total GAD 7 Score 20 21     Assessment & Plan:   Cody Diaz was seen today for diabetes.  Diagnoses and all orders for this visit:  Lateral knee pain, left -     naproxen (NAPROSYN) 500 MG tablet; Take 1 tablet (500 mg total) by mouth 2 (two) times daily with a meal. -     DG Knee AP/LAT W/Sunrise Left; Future  Generalized anxiety disorder -     escitalopram (LEXAPRO) 10 MG tablet; Take 1 tablet (10 mg total) by mouth daily. -     diazepam (VALIUM) 5 MG tablet; Take 1 tablet (5 mg total) by mouth every 8 (eight) hours as needed for anxiety.  Bilateral low back pain without sciatica -     diazepam (VALIUM) 5 MG tablet; Take 1 tablet (5 mg total) by mouth every 8 (eight) hours as needed for anxiety.  New onset type 2 diabetes mellitus (HCC) -     HgB A1c -     metFORMIN (GLUCOPHAGE XR) 500 MG 24 hr tablet; Take 2 tablets (1,000 mg total) by mouth daily after supper.    No orders of the defined types were placed in this encounter.    Follow-up: No Follow-up on file.   Cody Phi MD

## 2015-07-26 NOTE — Progress Notes (Signed)
F/U DM  Glucose running 105 116 Rt knee pain due to injury  Pain scale # 6 No tobacco user  No suicidal thoughts in the past two weeks

## 2015-07-27 DIAGNOSIS — E119 Type 2 diabetes mellitus without complications: Secondary | ICD-10-CM | POA: Insufficient documentation

## 2015-07-27 DIAGNOSIS — M25561 Pain in right knee: Secondary | ICD-10-CM | POA: Insufficient documentation

## 2015-07-27 DIAGNOSIS — M25562 Pain in left knee: Secondary | ICD-10-CM

## 2015-07-27 MED FILL — diazePAM 5 MG TABS: 5 | 20 days supply | Qty: 60 | Fill #0

## 2015-07-27 NOTE — Assessment & Plan Note (Signed)
New L knee pain x 3 weeks Plan NSAID X-ray May need ortho, advise patient apply for [place] discount

## 2015-07-27 NOTE — Assessment & Plan Note (Signed)
New onset in obese patient Start metformin Low carb diet Increase exercise

## 2015-07-27 NOTE — Assessment & Plan Note (Signed)
Persistent Patient not taking medication  Will restart lexapro Refilled valium to help with insomnia  Advised not drinking vodka

## 2015-10-18 ENCOUNTER — Ambulatory Visit: Payer: Self-pay | Admitting: Family Medicine

## 2015-11-21 ENCOUNTER — Emergency Department (HOSPITAL_COMMUNITY)
Admission: EM | Admit: 2015-11-21 | Discharge: 2015-11-22 | Disposition: A | Discharge status: Home / Self Care | Payer: Self-pay | Attending: Emergency Medicine | Admitting: Emergency Medicine

## 2015-11-21 ENCOUNTER — Encounter (HOSPITAL_COMMUNITY): Payer: Self-pay

## 2015-11-21 ENCOUNTER — Emergency Department (HOSPITAL_COMMUNITY): Payer: Self-pay

## 2015-11-21 DIAGNOSIS — R079 Chest pain, unspecified: Secondary | ICD-10-CM

## 2015-11-21 DIAGNOSIS — R0789 Other chest pain: Secondary | ICD-10-CM | POA: Insufficient documentation

## 2015-11-21 DIAGNOSIS — E119 Type 2 diabetes mellitus without complications: Secondary | ICD-10-CM | POA: Insufficient documentation

## 2015-11-21 DIAGNOSIS — I10 Essential (primary) hypertension: Secondary | ICD-10-CM | POA: Insufficient documentation

## 2015-11-21 LAB — CBC
HCT: 49.1 % (ref 39.0–52.0)
Hemoglobin: 16.7 g/dL (ref 13.0–17.0)
MCH: 33.3 pg (ref 26.0–34.0)
MCHC: 34 g/dL (ref 30.0–36.0)
MCV: 97.8 fL (ref 78.0–100.0)
PLATELETS: 152 10*3/uL (ref 150–400)
RBC: 5.02 MIL/uL (ref 4.22–5.81)
RDW: 13.3 % (ref 11.5–15.5)
WBC: 6 10*3/uL (ref 4.0–10.5)

## 2015-11-21 LAB — BASIC METABOLIC PANEL
ANION GAP: 10 (ref 5–15)
BUN: 13 mg/dL (ref 6–20)
CALCIUM: 9.1 mg/dL (ref 8.9–10.3)
CHLORIDE: 101 mmol/L (ref 101–111)
CO2: 26 mmol/L (ref 22–32)
Creatinine, Ser: 0.97 mg/dL (ref 0.61–1.24)
GFR calc non Af Amer: >60 mL/min (ref >60)
GLUCOSE: 108 mg/dL — AB (ref 65–99)
POTASSIUM: 3.6 mmol/L (ref 3.5–5.1)
Sodium: 137 mmol/L (ref 135–145)

## 2015-11-21 LAB — I-STAT TROPONIN, ED: TROPONIN I, POC: 0.01 ng/mL (ref 0.00–0.08)

## 2015-11-21 MED ORDER — GI COCKTAIL ~~LOC~~
30.0000 mL | Freq: Once | ORAL | Status: AC
  Administered 2015-11-21: 30 mL via ORAL
  Filled 2015-11-21: qty 30

## 2015-11-21 MED ORDER — IPRATROPIUM-ALBUTEROL 0.5-2.5 (3) MG/3ML IN SOLN
3.0000 mL | Freq: Once | RESPIRATORY_TRACT | Status: AC
  Administered 2015-11-21: 3 mL via RESPIRATORY_TRACT
  Filled 2015-11-21: qty 3

## 2015-11-21 MED ORDER — ALBUTEROL SULFATE HFA 108 (90 BASE) MCG/ACT IN AERS: 1.0000 | INHALATION_SPRAY | Freq: Four times a day (QID) | RESPIRATORY_TRACT | 0 refills | Status: DC | PRN

## 2015-11-21 MED ORDER — LORAZEPAM 1 MG PO TABS
1.0000 mg | ORAL_TABLET | Freq: Once | ORAL | Status: AC
  Administered 2015-11-21: 1 mg via ORAL
  Filled 2015-11-21: qty 1

## 2015-11-21 MED ORDER — LORAZEPAM 1 MG PO TABS: 1.0000 mg | ORAL_TABLET | Freq: Three times a day (TID) | ORAL | 0 refills | Status: DC | PRN

## 2015-11-21 NOTE — ED Provider Notes (Signed)
Emergency Department Provider Note   I have reviewed the triage vital signs and the nursing notes.   HISTORY  Chief Complaint Chest Pain   HPI Kendan Cornforth is a 60 y.o. male with PMH of DM, HTN, and GAD presents to the emergency department for evaluation of central chest pressure for the past 3 days. Patient has associated lightheadedness on standing. Chest pain is in the center of his chest and nonradiating. He denies associated nausea or vomiting. He does have occasional diaphoresis. No exacerbating or alleviating factors including exertional symptoms. No changes with food. The patient has noted some elevated blood pressures which is unusual for him. No prior history of similar pain.    History reviewed. No pertinent past medical history.  Patient Active Problem List   Diagnosis Date Noted  . New onset type 2 diabetes mellitus (HCC) 07/27/2015  . Lateral knee pain 07/27/2015  . Vitamin D deficiency 05/02/2015  . Hypertension 05/01/2015  . Generalized anxiety disorder 05/01/2015  . Caregiver stress 05/01/2015  . 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 Low back pain 08/10/2014  . Lightheadedness 08/10/2014    Past Surgical History:  Procedure Laterality Date  . KNEE SURGERY      Current Outpatient Rx  . Order #: [id number] Class: Normal  . Order #: [id number] Class: Historical Med  . Order #: [id number] Class: Print  . Order #: [id number] Class: Normal  . Order #: [id number] Class: Print  . Order #: [id number] Class: Normal  . Order #: [id number] Class: Print  . Order #: [id number] Class: Normal  . Order #: [id number] Class: Normal  . Order #: [id number] Class: Normal    Allergies Review of patient's allergies indicates no known allergies.  Family History  Problem Relation Age of Onset  . Diabetes Mother   . COPD Mother   . Hypertension Mother   . Heart disease Mother     Social History Social History  Substance Use Topics  . Smoking status: Never Smoker  . Smokeless  tobacco: Never Used  . Alcohol use 1.2 oz/week    2 Cans of beer per week     Comment: 2 beers per night     Review of Systems  Constitutional: No fever/chills; positive lightheadedness on standing.  Eyes: No visual changes. ENT: No sore throat. Cardiovascular: Positive chest pain. Respiratory: Denies shortness of breath. Gastrointestinal: No abdominal pain.  No nausea, no vomiting.  No diarrhea.  No constipation. Genitourinary: Negative for dysuria. Musculoskeletal: Negative for back pain. Skin: Negative for rash. Neurological: Negative for headaches, focal weakness or numbness.  10-point ROS otherwise negative.  ____________________________________________   PHYSICAL EXAM:  VITAL SIGNS: ED Triage Vitals  Enc Vitals Group     BP 11/21/15 2046 (!) 161/107     Pulse Rate 11/21/15 2046 84     Resp 11/21/15 2114 16     Temp 11/21/15 2046 98.3 F (36.8 C)     Temp src --      SpO2 11/21/15 2046 94 %     Weight 11/21/15 2046 225 lb (102.1 kg)     Height 11/21/15 2046 5\' 8"  (1.727 m)     Pain Score 11/21/15 2046 6   Constitutional: Alert and oriented. Well appearing and in no acute distress. Eyes: Conjunctivae are normal.  Head: Atraumatic. Nose: No congestion/rhinnorhea. Mouth/Throat: Mucous membranes are moist.  Oropharynx non-erythematous. Speaking in normal tone of voice. Managing oral secretions.  Neck: No stridor.   Cardiovascular: Normal rate, regular rhythm. Good peripheral circulation. Grossly normal heart sounds.   Respiratory: Normal  respiratory effort.  No retractions. Lungs with faint wheezing throughout.  Gastrointestinal: Soft and nontender. No distention.  Musculoskeletal: No lower extremity tenderness nor edema. No gross deformities of extremities. Neurologic:  Normal speech and language. No gross focal neurologic deficits are appreciated.  Skin:  Skin is warm, dry and intact. No rash noted. Psychiatric: Mood and affect are normal. Speech and behavior  are normal.  ____________________________________________   LABS (all labs ordered are listed, but only abnormal results are displayed)  Labs Reviewed  BASIC METABOLIC PANEL - Abnormal; Notable for the following:       Result Value   Glucose, Bld 108 (*)    All other components within normal limits  CBC  I-STAT TROPOININ, ED   ____________________________________________  EKG   EKG Interpretation  Date/Time:  Wednesday November 21 2015 20:44:14 EDT Ventricular Rate:  81 PR Interval:  160 QRS Duration: 86 QT Interval:  388 QTC Calculation: 450 R Axis:   35 Text Interpretation:  Normal sinus rhythm with sinus arrhythmia Normal ECG No STEMI.  Confirmed by Karina Lenderman MD, Nolan Lasser [phone]) on 11/21/2015 9:48:11 PM      ____________________________________________  RADIOLOGY  Dg Chest 2 View  Result Date: 11/21/2015 CLINICAL DATA:  Chest pain, shortness of breath and dizziness. EXAM: CHEST  2 VIEW COMPARISON:  None. FINDINGS: Cardiac silhouette is borderline enlarged. No mediastinal or hilar masses or evidence of adenopathy. Clear lungs.  No pleural effusion or pneumothorax. Skeletal structures are intact. IMPRESSION: No acute cardiopulmonary disease. Electronically Signed   By: Amie Portland M.D.   On: 11/21/2015 21:39    ____________________________________________   PROCEDURES  Procedure(s) performed:   Procedures  None ____________________________________________   INITIAL IMPRESSION / ASSESSMENT AND PLAN / ED COURSE  Pertinent labs & imaging results that were available during my care of the patient were reviewed by me and considered in my medical decision making (see chart for details).  Patient presents to the emergency department for evaluation of chest discomfort. Says he had elevated blood pressure. EKG is nonischemic and first troponin is negative. Patient with HEART score of 3 making him low risk for ACS. CXR reviewed with no acute findings.   Differential  includes all life-threatening causes for chest pain. This includes but is not exclusive to acute coronary syndrome, aortic dissection, pulmonary embolism, cardiac tamponade, community-acquired pneumonia, pericarditis, musculoskeletal chest wall pain, etc.  10:46 PM Patient with somewhat decreased chest pressure but continued symptoms. With 3 days of constant symptoms are very low suspicion for ACS given normal troponin and EKG. Plan for Ativan and reassessment as the patient reports a PMH of anxiety and possible similar symptoms in the past related to this. Also considering PE and/or vascular etiology of pain but feel this is much less likely given risk factors, vital signs, and available imaging.   11:26 PM Patient is feeling much better at this time. Plan for discharge home after breathing treatment. He will call his PCP in the AM to schedule outpatient f/u. Discussed my impression and return precautions in detail.   At this time, I do not feel there is any life-threatening condition present. I have reviewed and discussed all results (EKG, imaging, lab, urine as appropriate), exam findings with patient. I have reviewed nursing notes and appropriate previous records.  I feel the patient is safe to be discharged home without further emergent workup. Discussed usual and customary return precautions. Patient and family (if present) verbalize understanding and are comfortable with this plan.  Patient will follow-up  with their primary care provider. If they do not have a primary care provider, information for follow-up has been provided to them. All questions have been answered.  ____________________________________________  FINAL CLINICAL IMPRESSION(S) / ED DIAGNOSES  Final diagnoses:  Nonspecific chest pain     MEDICATIONS GIVEN DURING THIS VISIT:  Medications  gi cocktail (Maalox,Lidocaine,Donnatal) (30 mLs Oral Given 11/21/15 2222)  LORazepam (ATIVAN) tablet 1 mg (1 mg Oral Given 11/21/15 2301)    ipratropium-albuterol (DUONEB) 0.5-2.5 (3) MG/3ML nebulizer solution 3 mL (3 mLs Nebulization Given 11/21/15 2351)     NEW OUTPATIENT MEDICATIONS STARTED DURING THIS VISIT:  Discharge Medication List as of 11/21/2015 11:29 PM    START taking these medications   Details  albuterol (PROVENTIL HFA;VENTOLIN HFA) 108 (90 Base) MCG/ACT inhaler Inhale 1-2 puffs into the lungs every 6 (six) hours as needed for wheezing or shortness of breath., Starting Wed 11/21/2015, Print    LORazepam (ATIVAN) 1 MG tablet Take 1 tablet (1 mg total) by mouth 3 (three) times daily as needed for anxiety., Starting Wed 11/21/2015, Print          Note:  This document was prepared using Dragon voice recognition software and may include unintentional dictation errors.  Alona BeneJoshua Kaylen Nghiem, MD Emergency Medicine   Maia PlanJoshua G Ersilia Brawley, MD 11/22/15 [phone]

## 2015-11-21 NOTE — ED Notes (Signed)
Patient transported to X-ray 

## 2015-11-21 NOTE — Discharge Instructions (Signed)

## 2015-11-21 NOTE — ED Triage Notes (Signed)
Pt states that for the past three days he has been having CP with SOB and dizziness, with several syncopal episodes. CP is central, pt is diaphoretic.

## 2015-11-23 MED FILL — LORazepam 1 MG TABS: 1 | 3 days supply | Qty: 9 | Fill #0

## 2015-12-06 ENCOUNTER — Ambulatory Visit: Payer: Self-pay | Attending: Family Medicine | Admitting: Family Medicine

## 2015-12-06 ENCOUNTER — Encounter: Payer: Self-pay | Admitting: Family Medicine

## 2015-12-06 VITALS — BP 144/94 | HR 72 | Temp 97.9°F | Ht 68.0 in | Wt 233.6 lb

## 2015-12-06 DIAGNOSIS — R8290 Unspecified abnormal findings in urine: Secondary | ICD-10-CM

## 2015-12-06 DIAGNOSIS — M25561 Pain in right knee: Secondary | ICD-10-CM

## 2015-12-06 DIAGNOSIS — Z79899 Other long term (current) drug therapy: Secondary | ICD-10-CM | POA: Insufficient documentation

## 2015-12-06 DIAGNOSIS — B961 Klebsiella pneumoniae [K. pneumoniae] as the cause of diseases classified elsewhere: Secondary | ICD-10-CM | POA: Insufficient documentation

## 2015-12-06 DIAGNOSIS — N390 Urinary tract infection, site not specified: Secondary | ICD-10-CM

## 2015-12-06 DIAGNOSIS — Z7984 Long term (current) use of oral hypoglycemic drugs: Secondary | ICD-10-CM | POA: Insufficient documentation

## 2015-12-06 DIAGNOSIS — R55 Syncope and collapse: Secondary | ICD-10-CM

## 2015-12-06 DIAGNOSIS — I10 Essential (primary) hypertension: Secondary | ICD-10-CM

## 2015-12-06 DIAGNOSIS — E119 Type 2 diabetes mellitus without complications: Secondary | ICD-10-CM

## 2015-12-06 DIAGNOSIS — E559 Vitamin D deficiency, unspecified: Secondary | ICD-10-CM

## 2015-12-06 DIAGNOSIS — H52201 Unspecified astigmatism, right eye: Secondary | ICD-10-CM | POA: Insufficient documentation

## 2015-12-06 DIAGNOSIS — H269 Unspecified cataract: Secondary | ICD-10-CM | POA: Insufficient documentation

## 2015-12-06 DIAGNOSIS — M25562 Pain in left knee: Secondary | ICD-10-CM | POA: Insufficient documentation

## 2015-12-06 LAB — POCT URINALYSIS DIPSTICK
BILIRUBIN UA: NEGATIVE
Blood, UA: NEGATIVE
GLUCOSE UA: NEGATIVE
KETONES UA: NEGATIVE
Nitrite, UA: POSITIVE
SPEC GRAV UA: 1.02
Urobilinogen, UA: 1
pH, UA: 6

## 2015-12-06 LAB — GLUCOSE, POCT (MANUAL RESULT ENTRY): POC Glucose: 113 mg/dl — AB (ref 70–99)

## 2015-12-06 LAB — POCT GLYCOSYLATED HEMOGLOBIN (HGB A1C): Hemoglobin A1C: 6.1

## 2015-12-06 MED ORDER — LOSARTAN POTASSIUM 50 MG PO TABS: 50.0000 mg | ORAL_TABLET | Freq: Every day | ORAL | 3 refills | Status: DC

## 2015-12-06 MED ORDER — CEPHALEXIN 500 MG PO CAPS: 500.0000 mg | ORAL_CAPSULE | Freq: Three times a day (TID) | ORAL | 0 refills | Status: DC

## 2015-12-06 MED ORDER — ACETAMINOPHEN-CODEINE #3 300-30 MG PO TABS: 1.0000 | ORAL_TABLET | Freq: Three times a day (TID) | ORAL | 0 refills | Status: DC | PRN

## 2015-12-06 MED FILL — LOSARTAN POTASSIUM 50 MG TA: 50 | 30 days supply | Qty: 30 | Fill #0

## 2015-12-06 MED FILL — CEPHALEXIN 500 MG CAPSULE: 500 | 10 days supply | Qty: 30 | Fill #0

## 2015-12-06 MED FILL — ACETAMINOPHEN/COD #3 TABLET: 300-30 | 20 days supply | Qty: 60 | Fill #0

## 2015-12-06 NOTE — Progress Notes (Signed)
Hospital follow up: chest pains, EKG was normal on ER visit.  Pt is having knee pain. Pt can not see put pf left eye.  Pt thinks he has a UTI(urine has odor to it).  Pt declined flu shot.

## 2015-12-06 NOTE — Patient Instructions (Addendum)
Cody Diaz was seen today for hospitalization follow-up.  Diagnoses and all orders for this visit:  New onset type 2 diabetes mellitus (HCC) -     POCT glucose (manual entry) -     POCT glycosylated hemoglobin (Hb A1C)  Bad odor of urine -     Cancel: POCT urine pregnancy -     POCT urinalysis dipstick -     Urine culture -     cephALEXin (KEFLEX) 500 MG capsule; Take 1 capsule (500 mg total) by mouth 3 (three) times daily. -     Urine cytology ancillary only  Vitamin D deficiency -     Vitamin D, 25-hydroxy  Essential hypertension -     losartan (COZAAR) 50 MG tablet; Take 1 tablet (50 mg total) by mouth daily.  Syncope, unspecified syncope type -     US Carotid Duplex Bilateral; Future -     ECHOCARDIOGRAM COMPLETE; Future  Pain in both knees, unspecified chronicity -     DG Knee 4 Views W/Patella Right; Future -     acetaminophen-codeine (TYLENOL #3) 300-30 MG tablet; Take 1 tablet by mouth every 8 (eight) hours as needed for moderate pain.   F/u in 2 weeks for dizziness and HTN   Dr. Armen PickupFunches
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
## 2015-12-07 ENCOUNTER — Other Ambulatory Visit: Payer: Self-pay | Admitting: Family Medicine

## 2015-12-07 DIAGNOSIS — E559 Vitamin D deficiency, unspecified: Secondary | ICD-10-CM

## 2015-12-07 LAB — URINE CYTOLOGY ANCILLARY ONLY
Chlamydia: NEGATIVE
NEISSERIA GONORRHEA: NEGATIVE
Trichomonas: NEGATIVE

## 2015-12-07 LAB — VITAMIN D 25 HYDROXY (VIT D DEFICIENCY, FRACTURES): Vit D, 25-Hydroxy: 16 ng/mL — ABNORMAL LOW (ref 30–100)

## 2015-12-07 MED ORDER — ERGOCALCIFEROL 1.25 MG (50000 UT) PO CAPS: 50000.0000 [IU] | ORAL_CAPSULE | ORAL | 0 refills | Status: DC

## 2015-12-07 NOTE — Progress Notes (Signed)
drisd

## 2015-12-08 LAB — URINE CULTURE

## 2015-12-10 DIAGNOSIS — B961 Klebsiella pneumoniae [K. pneumoniae] as the cause of diseases classified elsewhere: Secondary | ICD-10-CM

## 2015-12-10 DIAGNOSIS — B9689 Other specified bacterial agents as the cause of diseases classified elsewhere: Secondary | ICD-10-CM | POA: Insufficient documentation

## 2015-12-10 DIAGNOSIS — N390 Urinary tract infection, site not specified: Secondary | ICD-10-CM | POA: Insufficient documentation

## 2015-12-10 DIAGNOSIS — R55 Syncope and collapse: Secondary | ICD-10-CM | POA: Insufficient documentation

## 2015-12-10 NOTE — Assessment & Plan Note (Signed)
Syncope in setting of chronic lightheadedness patient is not orthostatic Plan: ECHO Carotid dopplers

## 2015-12-10 NOTE — Assessment & Plan Note (Signed)
Positive klebsiella urine culture, sensitive to cipro It is unusual for men to get UTI Will plan for genital and rectal exam at f/u in 2 weeks , may need urology referral based on exam findings

## 2015-12-10 NOTE — Assessment & Plan Note (Signed)
A: HTN without orthostatic hypotension or tachycardia in setting of dizziness and syncope P:  Start losartan 50 mg daily

## 2015-12-10 NOTE — Assessment & Plan Note (Addendum)
Chronic worsening knee pain without evidence of acute infectious or inflammatory arthritis  Start tylenol #3 for pain control DG knee R

## 2015-12-13 ENCOUNTER — Telehealth: Payer: Self-pay

## 2015-12-13 NOTE — Telephone Encounter (Signed)
Contacted pt to go over lab results pt didn't answer and was unable to lvm  

## 2015-12-17 ENCOUNTER — Ambulatory Visit (HOSPITAL_BASED_OUTPATIENT_CLINIC_OR_DEPARTMENT_OTHER)
Admission: RE | Admit: 2015-12-17 | Discharge: 2015-12-17 | Disposition: A | Discharge status: Home / Self Care | Payer: Self-pay | Source: Ambulatory Visit | Attending: Family Medicine | Admitting: Family Medicine

## 2015-12-17 ENCOUNTER — Ambulatory Visit (HOSPITAL_COMMUNITY)
Admission: RE | Admit: 2015-12-17 | Discharge: 2015-12-17 | Disposition: A | Discharge status: Home / Self Care | Payer: Self-pay | Source: Ambulatory Visit | Attending: Family Medicine | Admitting: Family Medicine

## 2015-12-17 ENCOUNTER — Telehealth: Payer: Self-pay | Admitting: Family Medicine

## 2015-12-17 DIAGNOSIS — M25561 Pain in right knee: Secondary | ICD-10-CM | POA: Insufficient documentation

## 2015-12-17 DIAGNOSIS — I340 Nonrheumatic mitral (valve) insufficiency: Secondary | ICD-10-CM | POA: Insufficient documentation

## 2015-12-17 DIAGNOSIS — E669 Obesity, unspecified: Secondary | ICD-10-CM | POA: Insufficient documentation

## 2015-12-17 DIAGNOSIS — I10 Essential (primary) hypertension: Secondary | ICD-10-CM | POA: Insufficient documentation

## 2015-12-17 DIAGNOSIS — M25562 Pain in left knee: Secondary | ICD-10-CM | POA: Insufficient documentation

## 2015-12-17 DIAGNOSIS — R55 Syncope and collapse: Secondary | ICD-10-CM

## 2015-12-17 DIAGNOSIS — I6523 Occlusion and stenosis of bilateral carotid arteries: Secondary | ICD-10-CM | POA: Insufficient documentation

## 2015-12-17 DIAGNOSIS — Z6835 Body mass index (BMI) 35.0-35.9, adult: Secondary | ICD-10-CM | POA: Insufficient documentation

## 2015-12-17 LAB — VAS US CAROTID
LCCAPSYS: 128 cm/s
LEFT ECA DIAS: -10 cm/s
LEFT VERTEBRAL DIAS: 13 cm/s
LICAPSYS: -85 cm/s
Left CCA dist dias: 17 cm/s
Left CCA dist sys: 88 cm/s
Left CCA prox dias: 17 cm/s
Left ICA dist dias: -35 cm/s
Left ICA dist sys: -110 cm/s
Left ICA prox dias: -14 cm/s
RCCADSYS: -66 cm/s
RIGHT ECA DIAS: -15 cm/s
RIGHT VERTEBRAL DIAS: 9 cm/s
Right CCA prox dias: 13 cm/s
Right CCA prox sys: 116 cm/s

## 2015-12-17 NOTE — Progress Notes (Signed)
  Echocardiogram 2D Echocardiogram has been performed.  Nolon RodBrown, Tony 12/17/2015, 1:57 PM

## 2015-12-17 NOTE — Progress Notes (Signed)
**  Preliminary report by tech**  Carotid artery duplex completed. Findings are consistent with a 1-39 percent stenosis involving the right internal carotid artery and the left internal carotid artery. The vertebral arteries demonstrate antegrade flow.  12/17/15 2:28 PM Olen CordialGreg Clydia Nieves RVT

## 2015-12-17 NOTE — Telephone Encounter (Signed)
Pt. Returned call requesting results. Pt. States he is going to be home after 3. Please f/u

## 2015-12-19 NOTE — Telephone Encounter (Signed)
Pt was called on 10/25 no answer and no VM set up.

## 2015-12-21 ENCOUNTER — Ambulatory Visit: Payer: Self-pay | Admitting: Family Medicine

## 2015-12-21 NOTE — Telephone Encounter (Signed)
Pt was called on 10/27 to go over lab results, no vm is set up to leave VM pt lab results will be mailed out on 10/27.

## 2016-01-08 ENCOUNTER — Encounter: Payer: Self-pay | Admitting: Family Medicine

## 2016-01-08 ENCOUNTER — Ambulatory Visit: Payer: Self-pay | Attending: Family Medicine | Admitting: Family Medicine

## 2016-01-08 VITALS — BP 145/87 | HR 92 | Temp 98.0°F | Wt 245.0 lb

## 2016-01-08 DIAGNOSIS — I10 Essential (primary) hypertension: Secondary | ICD-10-CM | POA: Insufficient documentation

## 2016-01-08 DIAGNOSIS — M25562 Pain in left knee: Secondary | ICD-10-CM | POA: Insufficient documentation

## 2016-01-08 DIAGNOSIS — N401 Enlarged prostate with lower urinary tract symptoms: Secondary | ICD-10-CM | POA: Insufficient documentation

## 2016-01-08 DIAGNOSIS — M25561 Pain in right knee: Secondary | ICD-10-CM | POA: Insufficient documentation

## 2016-01-08 DIAGNOSIS — R350 Frequency of micturition: Secondary | ICD-10-CM | POA: Insufficient documentation

## 2016-01-08 LAB — HEMOCCULT GUIAC POC 1CARD (OFFICE): Fecal Occult Blood, POC: NEGATIVE

## 2016-01-08 MED ORDER — DIAZEPAM 5 MG PO TABS: 5.0000 mg | ORAL_TABLET | Freq: Two times a day (BID) | ORAL | 1 refills | Status: DC | PRN

## 2016-01-08 MED ORDER — TAMSULOSIN HCL 0.4 MG PO CAPS: 0.4000 mg | ORAL_CAPSULE | Freq: Every day | ORAL | 3 refills | Status: DC

## 2016-01-08 MED ORDER — NAPROXEN 500 MG PO TABS: 500.0000 mg | ORAL_TABLET | Freq: Two times a day (BID) | ORAL | 2 refills | Status: DC

## 2016-01-08 MED FILL — LOSARTAN POTASSIUM 50 MG TA: 50 | 30 days supply | Qty: 30 | Fill #1

## 2016-01-08 MED FILL — TAMSULOSIN HCL 0.4 MG CAP: 0.4 | 30 days supply | Qty: 30 | Fill #0

## 2016-01-08 MED FILL — ?NAPROXEN 500 MG TABLET: 500 MG | 30 days supply | Qty: 60 | Fill #0

## 2016-01-08 MED FILL — diazePAM 5 MG TABS: 5 | 30 days supply | Qty: 60 | Fill #0

## 2016-01-08 NOTE — Progress Notes (Signed)
Pt says he goes to the bathroom 4x in a 6hr period Pt wants something stronger for pain  Pt wants something to help him sleep at night

## 2016-01-08 NOTE — Patient Instructions (Signed)
Cody Diaz was seen today for knee pain and follow-up.  Diagnoses and all orders for this visit:  Essential hypertension  Pain in both knees, unspecified chronicity -     diazepam (VALIUM) 5 MG tablet; Take 1 tablet (5 mg total) by mouth every 12 (twelve) hours as needed for anxiety. -     naproxen (NAPROSYN) 500 MG tablet; Take 1 tablet (500 mg total) by mouth 2 (two) times daily with a meal.  Benign prostatic hyperplasia with urinary frequency -     tamsulosin (FLOMAX) 0.4 MG CAPS capsule; Take 1 capsule (0.4 mg total) by mouth daily. -     Hemoccult - 1 Card (office)   F/u in 6 weeks for BPH and knee pain  Dr. Armen PickupFunches   Benign Prostatic Hyperplasia An enlarged prostate (benign prostatic hyperplasia) is common in older men. You may experience the following:  Weak urine stream.  Dribbling.  Feeling like the bladder has not emptied completely.  Difficulty starting urination.  Getting up frequently at night to urinate.  Urinating more frequently during the day. HOME CARE INSTRUCTIONS  Monitor your prostatic hyperplasia for any changes. The following actions may help to alleviate any discomfort you are experiencing:  Give yourself time when you urinate.  Stay away from alcohol.  Avoid beverages containing caffeine, such as coffee, tea, and colas, because they can make the problem worse.  Avoid decongestants, antihistamines, and some prescription medicines that can make the problem worse.  Follow up with your health care provider for further treatment as recommended. SEEK MEDICAL CARE IF:  You are experiencing progressive difficulty voiding.  Your urine stream is progressively getting narrower.  You are awaking from sleep with the urge to void more frequently.  You are constantly feeling the need to void.  You experience loss of urine, especially in small amounts. SEEK IMMEDIATE MEDICAL CARE IF:   You develop increased pain with urination or are unable to  urinate.  You develop severe abdominal pain, vomiting, a high fever, or fainting.  You develop back pain or blood in your urine. MAKE SURE YOU:   Understand these instructions.  Will watch your condition.  Will get help right away if you are not doing well or get worse. This information is not intended to replace advice given to you by your health care provider. Make sure you discuss any questions you have with your health care provider. Document Released: 02/10/2005 Document Revised: 03/03/2014 Document Reviewed: 07/13/2012 Elsevier Interactive Patient Education  2017 [employer]

## 2016-01-08 NOTE — Progress Notes (Signed)
Subjective:  Patient ID: Cody Diaz, male    DOB: 02/10/1956  Age: 60 y.o. MRN: [id number]  CC: Knee Pain and Follow-up (prostate exam)   HPI Cody Diaz presents for   1. F/u UTI: he was diagnosed and treated for UTI at last visit. Urine culture + for Klebsiella. He reports frequent urination. He denies hematuria. Fever and chills. No dysuria.   2. Knee pain: this is chronic since 06/2015, L knee. Since 08/2015 R knee. He takes tylenol #3 and advil for pain. Slight swelling. No redness. He walks unassisted. In addition to pain he also endorses trouble sleeping. He has not completed R knee x-ray.   Social History  Substance Use Topics  . Smoking status: Never Smoker  . Smokeless tobacco: Never Used  . Alcohol use 1.2 oz/week    2 Cans of beer per week     Comment: 2 beers per night     Outpatient Medications Prior to Visit  Medication Sig Dispense Refill  . acetaminophen-codeine (TYLENOL #3) 300-30 MG tablet Take 1 tablet by mouth every 8 (eight) hours as needed for moderate pain. 60 tablet 0  . albuterol (PROVENTIL HFA;VENTOLIN HFA) 108 (90 Base) MCG/ACT inhaler Inhale 1-2 puffs into the lungs every 6 (six) hours as needed for wheezing or shortness of breath. 1 Inhaler 0  . ibuprofen (ADVIL,MOTRIN) 200 MG tablet Take 600 mg by mouth every 6 (six) hours as needed.    Marland Kitchen. losartan (COZAAR) 50 MG tablet Take 1 tablet (50 mg total) by mouth daily. 90 tablet 3  . cephALEXin (KEFLEX) 500 MG capsule Take 1 capsule (500 mg total) by mouth 3 (three) times daily. (Patient not taking: Reported on 01/08/2016) 30 capsule 0  . cyclobenzaprine (FLEXERIL) 10 MG tablet Take 1 tablet (10 mg total) by mouth 3 (three) times daily as needed for muscle spasms. (Patient not taking: Reported on 01/08/2016) 30 tablet 0  . ergocalciferol (DRISDOL) 50000 units capsule Take 1 capsule (50,000 Units total) by mouth once a week. (Patient not taking: Reported on 01/08/2016) 9 capsule 0  . LORazepam (ATIVAN) 1 MG  tablet Take 1 tablet (1 mg total) by mouth 3 (three) times daily as needed for anxiety. (Patient not taking: Reported on 01/08/2016) 9 tablet 0   No facility-administered medications prior to visit.     ROS Review of Systems  Constitutional: Negative for chills, fatigue, fever and unexpected weight change.  Eyes: Positive for visual disturbance.  Respiratory: Negative for cough and shortness of breath.   Cardiovascular: Negative for chest pain, palpitations and leg swelling.  Gastrointestinal: Negative for abdominal pain, blood in stool, constipation, diarrhea, nausea and vomiting.  Endocrine: Negative for polydipsia, polyphagia and polyuria.  Genitourinary: Positive for frequency.  Musculoskeletal: Positive for arthralgias (R knee ). Negative for back pain, gait problem, myalgias and neck pain.  Skin: Negative for rash.  Allergic/Immunologic: Negative for immunocompromised state.  Hematological: Negative for adenopathy. Does not bruise/bleed easily.  Psychiatric/Behavioral: Positive for dysphoric mood and sleep disturbance. Negative for suicidal ideas. The patient is nervous/anxious.     Objective:  BP (!) 145/87 (BP Location: Right Arm, Patient Position: Sitting, Cuff Size: Large)   Pulse 92   Temp 98 F (36.7 C) (Oral)   Wt 245 lb (111.1 kg)   SpO2 95%   BMI 37.25 kg/m   BP/Weight 01/08/2016 12/06/2015 11/21/2015  Systolic BP 145 144 156  Diastolic BP 87 94 88  Wt. (Lbs) 245 233.6 225  BMI 37.25 35.52 34.21  Physical Exam  Constitutional: He appears well-developed and well-nourished. No distress.  HENT:  Head: Normocephalic and atraumatic.  Neck: Normal range of motion. Neck supple.  Cardiovascular: Normal rate, regular rhythm, normal heart sounds and intact distal pulses.   Pulmonary/Chest: Effort normal and breath sounds normal.  Genitourinary: Rectal exam shows guaiac negative stool. Prostate is enlarged.  Musculoskeletal: He exhibits no edema.  Neurological: He  is alert.  Skin: Skin is warm and dry. No rash noted. No erythema.  Psychiatric: He has a normal mood and affect.   [facility] screen [facility] 01/08/2016 12/06/2015 07/26/2015  Decreased Interest 2 1 3   Down, Depressed, Hopeless 2 3 2   PHQ - 2 Score 4 4 5   Altered sleeping 3 3 3   Tired, decreased energy 3 2 3   Change in appetite 2 1 3   Feeling bad or failure about yourself  2 2 3   Trouble concentrating 1 0 1  Moving slowly or fidgety/restless 2 3 3   Suicidal thoughts 1 1 1   PHQ-9 Score 18 16 22    GAD 7 : Generalized Anxiety Score 01/08/2016 12/06/2015 07/26/2015 05/01/2015  Nervous, Anxious, on Edge 3 3 3 3   Control/stop worrying 3 3 3 3   Worry too much - different things 3 3 3 3   Trouble relaxing 3 3 3 3   Restless 2 2 3 3   Easily annoyed or irritable 2 3 2 3   Afraid - awful might happen 3 3 3 3   Total GAD 7 Score 19 20 20 21      Assessment & Plan:  Cody Diaz was seen today for knee pain and follow-up.  Diagnoses and all orders for this visit:  Essential hypertension -     losartan (COZAAR) 100 MG tablet; Take 1 tablet (100 mg total) by mouth daily.  Pain in both knees, unspecified chronicity -     diazepam (VALIUM) 5 MG tablet; Take 1 tablet (5 mg total) by mouth every 12 (twelve) hours as needed for anxiety. -     naproxen (NAPROSYN) 500 MG tablet; Take 1 tablet (500 mg total) by mouth 2 (two) times daily with a meal.  Benign prostatic hyperplasia with urinary frequency -     tamsulosin (FLOMAX) 0.4 MG CAPS capsule; Take 1 capsule (0.4 mg total) by mouth daily. -     Hemoccult - 1 Card (office)   There are no diagnoses linked to this encounter.  No orders of the defined types were placed in this encounter.   Follow-up: Return in about 6 weeks (around 02/19/2016) for BPH and knee pain .   Dessa PhiJosalyn Tandy Grawe MD

## 2016-01-12 DIAGNOSIS — R350 Frequency of micturition: Secondary | ICD-10-CM

## 2016-01-12 DIAGNOSIS — N401 Enlarged prostate with lower urinary tract symptoms: Secondary | ICD-10-CM | POA: Insufficient documentation

## 2016-01-12 MED ORDER — LOSARTAN POTASSIUM 100 MG PO TABS: 100.0000 mg | ORAL_TABLET | Freq: Every day | ORAL | 11 refills | Status: DC

## 2016-01-12 NOTE — Assessment & Plan Note (Signed)
A: HTN, BP still above goal Med: compliant P: Increase losartan to 100 mg daily

## 2016-01-12 NOTE — Assessment & Plan Note (Signed)
Enlarged prostate on exam with recent UTI, consistent with BPH   Plan: flomax

## 2016-01-12 NOTE — Assessment & Plan Note (Signed)
Chronic knee pain No evidence of acute inflammatory or infectious arthritis   Plan: Tylenol #3 Valium

## 2016-03-21 MED FILL — LOSARTAN POTASSIUM 100 MG T: 100 | 30 days supply | Qty: 30 | Fill #0

## 2016-03-21 MED FILL — TAMSULOSIN HCL 0.4 MG CAP: 0.4 | 30 days supply | Qty: 30 | Fill #1

## 2016-03-21 MED FILL — NAPROXEN 500 MG TABLET: 500 | 30 days supply | Qty: 60 | Fill #1

## 2016-03-21 MED FILL — diazePAM 5 MG TABS: 5 | 30 days supply | Qty: 60 | Fill #1

## 2016-04-22 MED FILL — LOSARTAN POTASSIUM 100 MG T: 100 | 30 days supply | Qty: 30 | Fill #1

## 2016-04-22 MED FILL — TAMSULOSIN HCL 0.4 MG CAP: 0.4 | 30 days supply | Qty: 30 | Fill #2

## 2016-06-25 MED FILL — LOSARTAN POTASSIUM 100 MG T: 100 | 30 days supply | Qty: 30 | Fill #2

## 2016-06-25 MED FILL — TAMSULOSIN HCL 0.4 MG CAP: 0.4 | 30 days supply | Qty: 30 | Fill #3

## 2016-07-09 ENCOUNTER — Encounter: Payer: Self-pay | Admitting: Family Medicine

## 2016-07-15 ENCOUNTER — Ambulatory Visit: Payer: Self-pay | Admitting: Family Medicine

## 2016-07-23 ENCOUNTER — Other Ambulatory Visit: Payer: Self-pay | Admitting: Family Medicine

## 2016-07-23 DIAGNOSIS — R350 Frequency of micturition: Principal | ICD-10-CM

## 2016-07-23 DIAGNOSIS — N401 Enlarged prostate with lower urinary tract symptoms: Secondary | ICD-10-CM

## 2016-07-23 MED FILL — ?TAMSULOSIN HCL 0.4 MG CAP: 0.4 | 30 days supply | Qty: 30 | Fill #0

## 2016-07-23 MED FILL — LOSARTAN POTASSIUM 100 MG T: 100 | 30 days supply | Qty: 30 | Fill #3

## 2016-08-26 ENCOUNTER — Encounter: Payer: Self-pay | Admitting: Family Medicine

## 2016-08-26 ENCOUNTER — Ambulatory Visit: Payer: Self-pay | Attending: Family Medicine | Admitting: Family Medicine

## 2016-08-26 VITALS — BP 137/84 | HR 76 | Temp 98.0°F | Ht 68.0 in | Wt 258.2 lb

## 2016-08-26 DIAGNOSIS — F419 Anxiety disorder, unspecified: Secondary | ICD-10-CM | POA: Insufficient documentation

## 2016-08-26 DIAGNOSIS — N189 Chronic kidney disease, unspecified: Secondary | ICD-10-CM | POA: Insufficient documentation

## 2016-08-26 DIAGNOSIS — I130 Hypertensive heart and chronic kidney disease with heart failure and stage 1 through stage 4 chronic kidney disease, or unspecified chronic kidney disease: Secondary | ICD-10-CM | POA: Insufficient documentation

## 2016-08-26 DIAGNOSIS — B9689 Other specified bacterial agents as the cause of diseases classified elsewhere: Secondary | ICD-10-CM

## 2016-08-26 DIAGNOSIS — R350 Frequency of micturition: Secondary | ICD-10-CM | POA: Insufficient documentation

## 2016-08-26 DIAGNOSIS — R7303 Prediabetes: Secondary | ICD-10-CM

## 2016-08-26 DIAGNOSIS — N401 Enlarged prostate with lower urinary tract symptoms: Secondary | ICD-10-CM | POA: Insufficient documentation

## 2016-08-26 DIAGNOSIS — M545 Low back pain, unspecified: Secondary | ICD-10-CM

## 2016-08-26 DIAGNOSIS — G8929 Other chronic pain: Secondary | ICD-10-CM | POA: Insufficient documentation

## 2016-08-26 DIAGNOSIS — F411 Generalized anxiety disorder: Secondary | ICD-10-CM | POA: Insufficient documentation

## 2016-08-26 DIAGNOSIS — M25562 Pain in left knee: Secondary | ICD-10-CM | POA: Insufficient documentation

## 2016-08-26 DIAGNOSIS — M25561 Pain in right knee: Secondary | ICD-10-CM | POA: Insufficient documentation

## 2016-08-26 DIAGNOSIS — I10 Essential (primary) hypertension: Secondary | ICD-10-CM

## 2016-08-26 DIAGNOSIS — B961 Klebsiella pneumoniae [K. pneumoniae] as the cause of diseases classified elsewhere: Secondary | ICD-10-CM

## 2016-08-26 DIAGNOSIS — I509 Heart failure, unspecified: Secondary | ICD-10-CM | POA: Insufficient documentation

## 2016-08-26 DIAGNOSIS — N390 Urinary tract infection, site not specified: Secondary | ICD-10-CM

## 2016-08-26 LAB — POCT GLYCOSYLATED HEMOGLOBIN (HGB A1C): Hemoglobin A1C: 6.3

## 2016-08-26 LAB — POCT URINALYSIS DIPSTICK
Bilirubin, UA: NEGATIVE
Blood, UA: NEGATIVE
GLUCOSE UA: NEGATIVE
Ketones, UA: NEGATIVE
Nitrite, UA: POSITIVE
Protein, UA: NEGATIVE
SPEC GRAV UA: 1.02 (ref 1.010–1.025)
UROBILINOGEN UA: 0.2 U/dL
pH, UA: 6 (ref 5.0–8.0)

## 2016-08-26 LAB — GLUCOSE, POCT (MANUAL RESULT ENTRY): POC Glucose: 124 mg/dl — AB (ref 70–99)

## 2016-08-26 MED ORDER — TAMSULOSIN HCL 0.4 MG PO CAPS: 0.8000 mg | ORAL_CAPSULE | Freq: Every day | ORAL | 5 refills | Status: DC

## 2016-08-26 MED ORDER — ACETAMINOPHEN-CODEINE #3 300-30 MG PO TABS: 1.0000 | ORAL_TABLET | Freq: Three times a day (TID) | ORAL | 2 refills | Status: DC | PRN

## 2016-08-26 MED ORDER — ALBUTEROL SULFATE HFA 108 (90 BASE) MCG/ACT IN AERS: 1.0000 | INHALATION_SPRAY | Freq: Four times a day (QID) | RESPIRATORY_TRACT | 0 refills | Status: DC | PRN

## 2016-08-26 MED ORDER — LOSARTAN POTASSIUM 100 MG PO TABS: 100.0000 mg | ORAL_TABLET | Freq: Every day | ORAL | 11 refills | Status: DC

## 2016-08-26 MED ORDER — DIAZEPAM 5 MG PO TABS: 5.0000 mg | ORAL_TABLET | Freq: Two times a day (BID) | ORAL | 2 refills | Status: DC | PRN

## 2016-08-26 MED ORDER — DIAZEPAM 5 MG PO TABS: 5.0000 mg | ORAL_TABLET | Freq: Two times a day (BID) | ORAL | 1 refills | Status: DC | PRN

## 2016-08-26 MED FILL — ?TAMSULOSIN HCL 0.4 MG CAP: 0.4 | 30 days supply | Qty: 60 | Fill #0

## 2016-08-26 MED FILL — LOSARTAN POTASSIUM 100 MG T: 100 | 30 days supply | Qty: 30 | Fill #0

## 2016-08-26 MED FILL — !VENTOLIN HFA INHALER: 108 (90 BAS | 25 days supply | Qty: 18 | Fill #0

## 2016-08-26 MED FILL — diazePAM 5 MG TABS: 5 | 30 days supply | Qty: 60 | Fill #0

## 2016-08-26 NOTE — Patient Instructions (Addendum)
Whit was seen today for hypertension.  Diagnoses and all orders for this visit:  Benign prostatic hyperplasia with urinary frequency -     tamsulosin (FLOMAX) 0.4 MG CAPS capsule; Take 2 capsules (0.8 mg total) by mouth daily after supper. -     CMP14+EGFR  Prediabetes -     HgB A1c -     Glucose (CBG) -     POCT urinalysis dipstick  Essential hypertension -     Cancel: BMP8+EGFR -     losartan (COZAAR) 100 MG tablet; Take 1 tablet (100 mg total) by mouth daily.  Pain in both knees, unspecified chronicity -     Discontinue: diazepam (VALIUM) 5 MG tablet; Take 1 tablet (5 mg total) by mouth every 12 (twelve) hours as needed for anxiety. -     Discontinue: acetaminophen-codeine (TYLENOL #3) 300-30 MG tablet; Take 1 tablet by mouth every 8 (eight) hours as needed for moderate pain. -     diazepam (VALIUM) 5 MG tablet; Take 1 tablet (5 mg total) by mouth every 12 (twelve) hours as needed for anxiety.  Chronic bilateral low back pain without sciatica -     Discontinue: diazepam (VALIUM) 5 MG tablet; Take 1 tablet (5 mg total) by mouth every 12 (twelve) hours as needed for anxiety. -     Discontinue: acetaminophen-codeine (TYLENOL #3) 300-30 MG tablet; Take 1 tablet by mouth every 8 (eight) hours as needed for moderate pain. -     diazepam (VALIUM) 5 MG tablet; Take 1 tablet (5 mg total) by mouth every 12 (twelve) hours as needed for anxiety.  Urinary frequency -     Urine Culture  Generalized anxiety disorder  Other orders -     albuterol (PROVENTIL HFA;VENTOLIN HFA) 108 (90 Base) MCG/ACT inhaler; Inhale 1-2 puffs into the lungs every 6 (six) hours as needed for wheezing or shortness of breath.  restart valium for anxiety and back pain Increase Flomax to 0.8 mg nightly  exercise for 30 minute of cardio 30-45 minutes of weights (10 lbs hand weights, 30 lbs on machine), 15 reps, 3 sets to build lean muscle  F/u in 3 months for HTN and anxiety   Dr. Adrian Blackwater

## 2016-08-26 NOTE — Progress Notes (Signed)
Subjective:  Patient ID: Cody Diaz, male    DOB: 1955-09-13  Age: 61 y.o. MRN: [id number]  CC: Hypertension   HPI Cody Diaz has HTN, BPH, generalized anxiety  and chronic low back pain presents for   1. CHRONIC HYPERTENSION  Disease Monitoring  Blood pressure range: not checking   Chest pain: no   Dyspnea: no   Claudication: no   Medication compliance: yes  Medication Side Effects  Lightheadedness: no   Urinary frequency: yes   Edema: no    2. Urinary frequency: day and night with nocturia. He has urgency. No dysuria. Urine output is low at time.   3. Anxiety: this is chronic. He lives with his mother who is sick. He believes his mother will pass away soon. She has congestive heart failure, CAD and CKD. He worries about her passing away. He takes valium for anxiety.   4. Chronic knee pain: he has persistent pain. Tylenol #3 has helped in the past. He has also taken flexeril in the past. He now takes valium for anxiety. He reports pain limits exercise. If he rides a stationary bike for more than 5-6 miles he develops knee swelling.      Social History  Substance Use Topics  . Smoking status: Never Smoker  . Smokeless tobacco: Never Used  . Alcohol use 1.2 oz/week    2 Cans of beer per week     Comment: 2 beers per night     Outpatient Medications Prior to Visit  Medication Sig Dispense Refill  . acetaminophen-codeine (TYLENOL #3) 300-30 MG tablet Take 1 tablet by mouth every 8 (eight) hours as needed for moderate pain. 60 tablet 0  . albuterol (PROVENTIL HFA;VENTOLIN HFA) 108 (90 Base) MCG/ACT inhaler Inhale 1-2 puffs into the lungs every 6 (six) hours as needed for wheezing or shortness of breath. 1 Inhaler 0  . cyclobenzaprine (FLEXERIL) 10 MG tablet Take 1 tablet (10 mg total) by mouth 3 (three) times daily as needed for muscle spasms. (Patient not taking: Reported on 01/08/2016) 30 tablet 0  . diazepam (VALIUM) 5 MG tablet Take 1 tablet (5 mg total) by mouth  every 12 (twelve) hours as needed for anxiety. 60 tablet 1  . ibuprofen (ADVIL,MOTRIN) 200 MG tablet Take 600 mg by mouth every 6 (six) hours as needed.    Marland Kitchen losartan (COZAAR) 100 MG tablet Take 1 tablet (100 mg total) by mouth daily. 30 tablet 11  . naproxen (NAPROSYN) 500 MG tablet Take 1 tablet (500 mg total) by mouth 2 (two) times daily with a meal. 60 tablet 2  . tamsulosin (FLOMAX) 0.4 MG CAPS capsule TAKE 1 CAPSULE BY MOUTH DAILY. 30 capsule 0   No facility-administered medications prior to visit.     ROS Review of Systems  Constitutional: Negative for chills, fatigue, fever and unexpected weight change.  Eyes: Positive for visual disturbance.  Respiratory: Negative for cough and shortness of breath.   Cardiovascular: Negative for chest pain, palpitations and leg swelling.  Gastrointestinal: Negative for abdominal pain, blood in stool, constipation, diarrhea, nausea and vomiting.  Endocrine: Negative for polydipsia, polyphagia and polyuria.  Genitourinary: Positive for frequency.  Musculoskeletal: Positive for arthralgias (R knee ) and back pain. Negative for gait problem, myalgias and neck pain.  Skin: Negative for rash.  Allergic/Immunologic: Negative for immunocompromised state.  Hematological: Negative for adenopathy. Does not bruise/bleed easily.  Psychiatric/Behavioral: Positive for dysphoric mood and sleep disturbance. Negative for suicidal ideas. The patient is nervous/anxious.  Objective:  BP 137/84   Pulse 76   Temp 98 F (36.7 C) (Oral)   Ht _0  (1.727 m)   Wt 258 lb 3.2 oz (117.1 kg)   SpO2 94%   BMI 39.26 kg/m   BP/Weight 08/26/2016 01/08/2016 12/21/2534  Systolic BP 644 034 742  Diastolic BP 84 87 94  Wt. (Lbs) 258.2 245 233.6  BMI 39.26 37.25 35.52    Physical Exam  Constitutional: He appears well-developed and well-nourished. No distress.  HENT:  Head: Normocephalic and atraumatic.  Neck: Normal range of motion. Neck supple.  Cardiovascular:  Normal rate, regular rhythm, normal heart sounds and intact distal pulses.   Pulmonary/Chest: Effort normal and breath sounds normal.  Genitourinary: Rectal exam shows guaiac negative stool. Prostate is enlarged.  Musculoskeletal: He exhibits no edema.  Neurological: He is alert.  Skin: Skin is warm and dry. No rash noted. No erythema.  Psychiatric: He has a normal mood and affect.   Lab Results  Component Value Date   HGBA1C 6.3 08/26/2016   CBG 124   UA: small LE, positive nitrite   Chemistry      Component Value Date/Time   NA 142 08/26/2016 1415   K 4.4 08/26/2016 1415   CL 99 08/26/2016 1415   CO2 26 08/26/2016 1415   BUN 10 08/26/2016 1415   CREATININE 0.98 08/26/2016 1415   CREATININE 0.92 05/01/2015 1034      Component Value Date/Time   CALCIUM 9.5 08/26/2016 1415   ALKPHOS 82 08/26/2016 1415   AST 31 08/26/2016 1415   ALT 49 (H) 08/26/2016 1415   BILITOT 0.4 08/26/2016 1415       Assessment & Plan:  Cody Diaz was seen today for hypertension.  Diagnoses and all orders for this visit:  Benign prostatic hyperplasia with urinary frequency -     tamsulosin (FLOMAX) 0.4 MG CAPS capsule; Take 2 capsules (0.8 mg total) by mouth daily after supper. -     CMP14+EGFR  Prediabetes -     HgB A1c -     Glucose (CBG) -     POCT urinalysis dipstick  Essential hypertension -     Cancel: BMP8+EGFR -     losartan (COZAAR) 100 MG tablet; Take 1 tablet (100 mg total) by mouth daily.  Pain in both knees, unspecified chronicity -     Discontinue: diazepam (VALIUM) 5 MG tablet; Take 1 tablet (5 mg total) by mouth every 12 (twelve) hours as needed for anxiety. -     Discontinue: acetaminophen-codeine (TYLENOL #3) 300-30 MG tablet; Take 1 tablet by mouth every 8 (eight) hours as needed for moderate pain. -     diazepam (VALIUM) 5 MG tablet; Take 1 tablet (5 mg total) by mouth every 12 (twelve) hours as needed for anxiety.  Chronic bilateral low back pain without sciatica -      Discontinue: diazepam (VALIUM) 5 MG tablet; Take 1 tablet (5 mg total) by mouth every 12 (twelve) hours as needed for anxiety. -     Discontinue: acetaminophen-codeine (TYLENOL #3) 300-30 MG tablet; Take 1 tablet by mouth every 8 (eight) hours as needed for moderate pain. -     diazepam (VALIUM) 5 MG tablet; Take 1 tablet (5 mg total) by mouth every 12 (twelve) hours as needed for anxiety.  Urinary frequency -     Urine Culture  Other orders -     albuterol (PROVENTIL HFA;VENTOLIN HFA) 108 (90 Base) MCG/ACT inhaler; Inhale 1-2 puffs into the lungs  every 6 (six) hours as needed for wheezing or shortness of breath.   There are no diagnoses linked to this encounter.  No orders of the defined types were placed in this encounter.   Follow-up: Return in about 3 months (around 11/26/2016) for HTN and anxiety .   Cody Nearing MD

## 2016-08-27 DIAGNOSIS — R350 Frequency of micturition: Secondary | ICD-10-CM | POA: Insufficient documentation

## 2016-08-27 LAB — CMP14+EGFR
ALBUMIN: 4.2 g/dL (ref 3.6–4.8)
ALK PHOS: 82 IU/L (ref 39–117)
ALT: 49 IU/L — ABNORMAL HIGH (ref 0–44)
AST: 31 IU/L (ref 0–40)
Albumin/Globulin Ratio: 1.4 (ref 1.2–2.2)
BUN / CREAT RATIO: 10 (ref 10–24)
BUN: 10 mg/dL (ref 8–27)
Bilirubin Total: 0.4 mg/dL (ref 0.0–1.2)
CO2: 26 mmol/L (ref 20–29)
CREATININE: 0.98 mg/dL (ref 0.76–1.27)
Calcium: 9.5 mg/dL (ref 8.6–10.2)
Chloride: 99 mmol/L (ref 96–106)
GFR, EST AFRICAN AMERICAN: 96 mL/min/{1.73_m2} (ref >59)
GFR, EST NON AFRICAN AMERICAN: 83 mL/min/{1.73_m2} (ref >59)
GLOBULIN, TOTAL: 3.1 g/dL (ref 1.5–4.5)
GLUCOSE: 113 mg/dL — AB (ref 65–99)
Potassium: 4.4 mmol/L (ref 3.5–5.2)
SODIUM: 142 mmol/L (ref 134–144)
TOTAL PROTEIN: 7.3 g/dL (ref 6.0–8.5)

## 2016-08-27 NOTE — Assessment & Plan Note (Signed)
Well controlled continue losartan 50 mg daily

## 2016-08-27 NOTE — Assessment & Plan Note (Signed)
Persistent anxiety Valium refilled

## 2016-08-27 NOTE — Assessment & Plan Note (Signed)
persistent A1c now 6.3 Patient cautioned to work on weight reduction and low sugar diet

## 2016-08-27 NOTE — Assessment & Plan Note (Signed)
Frequency Known BPH Flomax increase to 0.8 mg nightly Urine sent for culture since LE and nitrites on UA

## 2016-08-29 ENCOUNTER — Telehealth: Payer: Self-pay

## 2016-08-29 ENCOUNTER — Telehealth: Payer: Self-pay | Admitting: Family Medicine

## 2016-08-29 LAB — URINE CULTURE

## 2016-08-29 MED ORDER — CEPHALEXIN 500 MG PO CAPS: 500.0000 mg | ORAL_CAPSULE | Freq: Four times a day (QID) | ORAL | 0 refills | Status: DC

## 2016-08-29 MED FILL — CEPHALEXIN 500 MG CAPSULE: 500 | 7 days supply | Qty: 28 | Fill #0

## 2016-08-29 NOTE — Addendum Note (Signed)
Addended by: Dessa PhiFUNCHES, Devin Foskey on: 08/29/2016 10:37 AM   Modules accepted: Orders

## 2016-08-29 NOTE — Telephone Encounter (Signed)
Pt returned phone call and was informed of lab results. 

## 2016-08-29 NOTE — Telephone Encounter (Signed)
Pt. Returned nurse call regarding results. Please f/u  °

## 2016-08-29 NOTE — Telephone Encounter (Signed)
Pt was called and there was no VM set up to leave a message. 

## 2016-09-01 ENCOUNTER — Ambulatory Visit: Payer: Self-pay

## 2016-09-01 ENCOUNTER — Ambulatory Visit: Payer: Self-pay | Attending: Family Medicine

## 2016-09-08 ENCOUNTER — Other Ambulatory Visit: Payer: Self-pay | Admitting: Family Medicine

## 2016-09-08 ENCOUNTER — Ambulatory Visit: Payer: Self-pay | Attending: Family Medicine

## 2016-09-08 MED ORDER — ALBUTEROL SULFATE HFA 108 (90 BASE) MCG/ACT IN AERS: 1.0000 | INHALATION_SPRAY | Freq: Four times a day (QID) | RESPIRATORY_TRACT | 3 refills | Status: DC | PRN

## 2016-09-17 ENCOUNTER — Telehealth: Payer: Self-pay | Admitting: Family Medicine

## 2016-09-17 DIAGNOSIS — B9689 Other specified bacterial agents as the cause of diseases classified elsewhere: Secondary | ICD-10-CM

## 2016-09-17 DIAGNOSIS — N390 Urinary tract infection, site not specified: Secondary | ICD-10-CM

## 2016-09-17 DIAGNOSIS — B961 Klebsiella pneumoniae [K. pneumoniae] as the cause of diseases classified elsewhere: Principal | ICD-10-CM

## 2016-09-17 NOTE — Telephone Encounter (Signed)
Pt. Called requesting to get a refill on cephALEXin (KEFLEX) 500 MG capsule Pt. States he is still feeling the same symptoms that he had and is waiting for the OC So that he can go to the urologist. Please f/u with pt.

## 2016-09-17 NOTE — Telephone Encounter (Signed)
Will route to PCP 

## 2016-09-18 MED ORDER — CEPHALEXIN 500 MG PO CAPS: 500.0000 mg | ORAL_CAPSULE | Freq: Four times a day (QID) | ORAL | 0 refills | Status: DC

## 2016-09-18 MED FILL — CEPHALEXIN 500 MG CAPSULE: 500 | 7 days supply | Qty: 28 | Fill #0

## 2016-09-18 NOTE — Telephone Encounter (Signed)
Please inform patient that keflex refilled If symptoms persist he will need to return for UA and urine culture to check for antibiotic resistance

## 2016-09-19 NOTE — Telephone Encounter (Signed)
Pt was called and informed of medication being refilled. 

## 2016-09-25 ENCOUNTER — Ambulatory Visit: Payer: Self-pay | Attending: Family Medicine | Admitting: Family Medicine

## 2016-09-25 ENCOUNTER — Encounter: Payer: Self-pay | Admitting: Family Medicine

## 2016-09-25 VITALS — BP 100/62 | HR 76 | Temp 97.5°F | Ht 68.0 in | Wt 251.0 lb

## 2016-09-25 DIAGNOSIS — N390 Urinary tract infection, site not specified: Secondary | ICD-10-CM

## 2016-09-25 DIAGNOSIS — N401 Enlarged prostate with lower urinary tract symptoms: Secondary | ICD-10-CM

## 2016-09-25 DIAGNOSIS — B961 Klebsiella pneumoniae [K. pneumoniae] as the cause of diseases classified elsewhere: Secondary | ICD-10-CM

## 2016-09-25 DIAGNOSIS — F329 Major depressive disorder, single episode, unspecified: Secondary | ICD-10-CM | POA: Insufficient documentation

## 2016-09-25 DIAGNOSIS — M545 Low back pain, unspecified: Secondary | ICD-10-CM

## 2016-09-25 DIAGNOSIS — M25561 Pain in right knee: Secondary | ICD-10-CM

## 2016-09-25 DIAGNOSIS — R350 Frequency of micturition: Secondary | ICD-10-CM

## 2016-09-25 DIAGNOSIS — M25562 Pain in left knee: Secondary | ICD-10-CM | POA: Insufficient documentation

## 2016-09-25 DIAGNOSIS — I10 Essential (primary) hypertension: Secondary | ICD-10-CM

## 2016-09-25 DIAGNOSIS — N400 Enlarged prostate without lower urinary tract symptoms: Secondary | ICD-10-CM | POA: Insufficient documentation

## 2016-09-25 DIAGNOSIS — E669 Obesity, unspecified: Secondary | ICD-10-CM | POA: Insufficient documentation

## 2016-09-25 DIAGNOSIS — G8929 Other chronic pain: Secondary | ICD-10-CM | POA: Insufficient documentation

## 2016-09-25 LAB — POCT URINALYSIS DIPSTICK
Bilirubin, UA: NEGATIVE
Blood, UA: NEGATIVE
Glucose, UA: NEGATIVE
KETONES UA: NEGATIVE
NITRITE UA: NEGATIVE
PROTEIN UA: NEGATIVE
UROBILINOGEN UA: 0.2 U/dL
pH, UA: 5 (ref 5.0–8.0)

## 2016-09-25 MED ORDER — NAPROXEN 500 MG PO TABS: 500.0000 mg | ORAL_TABLET | Freq: Two times a day (BID) | ORAL | 2 refills | Status: DC

## 2016-09-25 MED ORDER — LOSARTAN POTASSIUM 100 MG PO TABS: 50.0000 mg | ORAL_TABLET | Freq: Every day | ORAL | 11 refills | Status: DC

## 2016-09-25 MED ORDER — CYCLOBENZAPRINE HCL 10 MG PO TABS: 10.0000 mg | ORAL_TABLET | Freq: Three times a day (TID) | ORAL | 0 refills | Status: DC | PRN

## 2016-09-25 MED FILL — ?CYCLOBENZAPRINE 10 MG TABL: 10 | 6 days supply | Qty: 20 | Fill #0

## 2016-09-25 MED FILL — TAMSULOSIN HCL 0.4 MG CAP: 0.4 | 30 days supply | Qty: 60 | Fill #1

## 2016-09-25 MED FILL — NAPROXEN 500 MG TABLET: 500 | 30 days supply | Qty: 60 | Fill #0

## 2016-09-25 MED FILL — LOSARTAN POTASSIUM 100 MG T: 100 | 30 days supply | Qty: 30 | Fill #1

## 2016-09-25 NOTE — Patient Instructions (Addendum)
[name] was seen today for follow-up.  Diagnoses and all orders for this visit:  Urinary frequency -     Urine Culture -     POCT urinalysis dipstick  Pain in both knees, unspecified chronicity -     naproxen (NAPROSYN) 500 MG tablet; Take 1 tablet (500 mg total) by mouth 2 (two) times daily with a meal.  Essential hypertension -     losartan (COZAAR) 100 MG tablet; Take 0.5 tablets (50 mg total) by mouth daily.  Benign prostatic hyperplasia with urinary frequency  Chronic right-sided low back pain without sciatica -     DG Lumbar Spine 2-3 Views; Future -     cyclobenzaprine (FLEXERIL) 10 MG tablet; Take 1 tablet (10 mg total) by mouth 3 (three) times daily as needed for muscle spasms.   Please decrease losartan to 50 mg daily due to low BP Continue flomax 0.8 mg nightly for enlarged prostate  You will be called with urine culture result   Please take flexeril and naproxen for next weeks or so for low back pain. If pain persist please complete x-ray  F/u in 4 week for repeat BP check since decreasing losartan to 50 mg   Dr. Armen PickupFunches

## 2016-09-25 NOTE — Progress Notes (Signed)
Subjective:  Patient ID: Cody Diaz, male    DOB: 05-Oct-1955  Age: 61 y.o. MRN: [id number]  CC: Follow-up   HPI Cody Diaz has depression, HTN, obesity he presents for    1. UTI: he reports severe soreness on R low back. He frequency and hesitancy especially at night. He has some urgency with small about of incontinence. He is almost done with keflex. He denies hematuria.   2. HTN: taking losartan 100 mg daily. Having some dizziness or lightheadedness. No HA,CP or SOB. No leg swelling.   Social History  Substance Use Topics  . Smoking status: Never Smoker  . Smokeless tobacco: Never Used  . Alcohol use 1.2 oz/week    2 Cans of beer per week     Comment: 2 beers per night    Outpatient Medications Prior to Visit  Medication Sig Dispense Refill  . albuterol (PROVENTIL HFA;VENTOLIN HFA) 108 (90 Base) MCG/ACT inhaler Inhale 1-2 puffs into the lungs every 6 (six) hours as needed for wheezing or shortness of breath. 54 Inhaler 3  . cephALEXin (KEFLEX) 500 MG capsule Take 1 capsule (500 mg total) by mouth 4 (four) times daily. 28 capsule 0  . diazepam (VALIUM) 5 MG tablet Take 1 tablet (5 mg total) by mouth every 12 (twelve) hours as needed for anxiety. 60 tablet 2  . ibuprofen (ADVIL,MOTRIN) 200 MG tablet Take 600 mg by mouth every 6 (six) hours as needed.    Marland Kitchen. losartan (COZAAR) 100 MG tablet Take 1 tablet (100 mg total) by mouth daily. 30 tablet 11  . tamsulosin (FLOMAX) 0.4 MG CAPS capsule Take 2 capsules (0.8 mg total) by mouth daily after supper. 60 capsule 5  . naproxen (NAPROSYN) 500 MG tablet Take 1 tablet (500 mg total) by mouth 2 (two) times daily with a meal. (Patient not taking: Reported on 09/25/2016) 60 tablet 2   No facility-administered medications prior to visit.     ROS Review of Systems  Constitutional: Negative for chills, fatigue, fever and unexpected weight change.  Eyes: Negative for visual disturbance.  Respiratory: Negative for cough and shortness of  breath.   Cardiovascular: Negative for chest pain, palpitations and leg swelling.  Gastrointestinal: Negative for abdominal pain, blood in stool, constipation, diarrhea, nausea and vomiting.  Endocrine: Negative for polydipsia, polyphagia and polyuria.  Genitourinary: Positive for decreased urine volume, difficulty urinating, frequency and urgency. Negative for dysuria and hematuria.  Musculoskeletal: Positive for back pain. Negative for arthralgias, gait problem, myalgias and neck pain.  Skin: Negative for rash.  Allergic/Immunologic: Negative for immunocompromised state.  Hematological: Negative for adenopathy. Does not bruise/bleed easily.  Psychiatric/Behavioral: Negative for dysphoric mood, sleep disturbance and suicidal ideas. The patient is not nervous/anxious.     Objective:  BP 100/62   Pulse 76   Temp (!) 97.5 F (36.4 C) (Oral)   Ht 5\' 8"  (1.727 m)   Wt 251 lb (113.9 kg)   SpO2 92%   BMI 38.16 kg/m   BP/Weight 09/25/2016 08/26/2016 01/08/2016  Systolic BP 100 137 145  Diastolic BP 62 84 87  Wt. (Lbs) 251 258.2 245  BMI 38.16 39.26 37.25   Physical Exam  Constitutional: He appears well-developed and well-nourished. No distress.  HENT:  Head: Normocephalic and atraumatic.  Neck: Normal range of motion. Neck supple.  Cardiovascular: Normal rate, regular rhythm, normal heart sounds and intact distal pulses.   Pulmonary/Chest: Effort normal and breath sounds normal.  Musculoskeletal: He exhibits no edema.  Lumbar back: He exhibits tenderness (R lumbar ).  Neurological: He is alert.  Skin: Skin is warm and dry. No rash noted. No erythema.  Psychiatric: He has a normal mood and affect.       Chemistry      Component Value Date/Time   NA 142 08/26/2016 1415   K 4.4 08/26/2016 1415   CL 99 08/26/2016 1415   CO2 26 08/26/2016 1415   BUN 10 08/26/2016 1415   CREATININE 0.98 08/26/2016 1415   CREATININE 0.92 05/01/2015 1034      Component Value Date/Time    CALCIUM 9.5 08/26/2016 1415   ALKPHOS 82 08/26/2016 1415   AST 31 08/26/2016 1415   ALT 49 (H) 08/26/2016 1415   BILITOT 0.4 08/26/2016 1415       UA: small LE, negative nitrite and blood   Assessment & Plan:  Cody Diaz was seen today for follow-up.  Diagnoses and all orders for this visit:  Urinary frequency -     Urine Culture -     POCT urinalysis dipstick  Pain in both knees, unspecified chronicity -     naproxen (NAPROSYN) 500 MG tablet; Take 1 tablet (500 mg total) by mouth 2 (two) times daily with a meal.  Essential hypertension -     losartan (COZAAR) 100 MG tablet; Take 0.5 tablets (50 mg total) by mouth daily.  Benign prostatic hyperplasia with urinary frequency  Chronic right-sided low back pain without sciatica -     DG Lumbar Spine 2-3 Views; Future -     cyclobenzaprine (FLEXERIL) 10 MG tablet; Take 1 tablet (10 mg total) by mouth 3 (three) times daily as needed for muscle spasms.   There are no diagnoses linked to this encounter.  No orders of the defined types were placed in this encounter.   Follow-up: Return in about 4 weeks (around 10/23/2016) for BP check, losartan dose change f/u .   Dessa PhiJosalyn Tynan Boesel MD

## 2016-09-25 NOTE — Assessment & Plan Note (Signed)
Finish keflex Repeat urine culture pending

## 2016-09-25 NOTE — Assessment & Plan Note (Signed)
A: slightly over treated P:  Decrease losartan to 50 mg daily

## 2016-09-25 NOTE — Assessment & Plan Note (Signed)
Flexeril ordered

## 2016-09-27 LAB — URINE CULTURE: ORGANISM ID, BACTERIA: NO GROWTH

## 2016-10-02 ENCOUNTER — Telehealth: Payer: Self-pay

## 2016-10-02 NOTE — Telephone Encounter (Signed)
Pt was called and there is no vm set up to leave a message.

## 2016-10-02 NOTE — Telephone Encounter (Signed)
Patient called would lab results.  Please follow up

## 2016-10-03 NOTE — Telephone Encounter (Signed)
Pt. Called requesting to speak with his nurse regarding his Losartan. Pt. States his PCP had told him that he was going to be off the medication. Pt. States he received a call from the pharmacy stating that the Rx for ready for pick up. Please f/u with pt.

## 2016-10-03 NOTE — Telephone Encounter (Signed)
Pt was called and informed of lab results. 

## 2016-10-23 ENCOUNTER — Telehealth: Payer: Self-pay | Admitting: Family Medicine

## 2016-10-23 NOTE — Telephone Encounter (Signed)
Pt was informed to decrease losartan not to stop the medication.

## 2016-10-23 NOTE — Telephone Encounter (Signed)
Pt called states he missed a call , pt states that no follow up has been made about the bp medication. Pt is unsure if he is suppose to take medication, please f/up

## 2016-12-17 MED FILL — diazePAM 5 MG TABS: 5 | 30 days supply | Qty: 60 | Fill #1

## 2016-12-17 MED FILL — NAPROXEN 500 MG TABLET: 500 | 30 days supply | Qty: 60 | Fill #1

## 2016-12-17 MED FILL — ?TAMSULOSIN HCL 0.4 MG CAP: 0.4 | 30 days supply | Qty: 60 | Fill #2

## 2016-12-29 ENCOUNTER — Emergency Department (HOSPITAL_COMMUNITY)
Admission: EM | Admit: 2016-12-29 | Discharge: 2016-12-29 | Disposition: A | Discharge status: Home / Self Care | Payer: Worker's Compensation | Attending: Emergency Medicine | Admitting: Emergency Medicine

## 2016-12-29 ENCOUNTER — Other Ambulatory Visit: Payer: Self-pay

## 2016-12-29 ENCOUNTER — Emergency Department (HOSPITAL_COMMUNITY): Payer: Worker's Compensation

## 2016-12-29 ENCOUNTER — Encounter (HOSPITAL_COMMUNITY): Payer: Self-pay

## 2016-12-29 DIAGNOSIS — I10 Essential (primary) hypertension: Secondary | ICD-10-CM | POA: Insufficient documentation

## 2016-12-29 DIAGNOSIS — Z79899 Other long term (current) drug therapy: Secondary | ICD-10-CM | POA: Diagnosis not present

## 2016-12-29 DIAGNOSIS — W010XXA Fall on same level from slipping, tripping and stumbling without subsequent striking against object, initial encounter: Secondary | ICD-10-CM | POA: Diagnosis not present

## 2016-12-29 DIAGNOSIS — Y939 Activity, unspecified: Secondary | ICD-10-CM | POA: Diagnosis not present

## 2016-12-29 DIAGNOSIS — M25562 Pain in left knee: Secondary | ICD-10-CM | POA: Diagnosis present

## 2016-12-29 DIAGNOSIS — Y990 Civilian activity done for income or pay: Secondary | ICD-10-CM | POA: Insufficient documentation

## 2016-12-29 DIAGNOSIS — Y9289 Other specified places as the place of occurrence of the external cause: Secondary | ICD-10-CM | POA: Insufficient documentation

## 2016-12-29 MED ORDER — HYDROCODONE-ACETAMINOPHEN 5-325 MG PO TABS: 1.0000 | ORAL_TABLET | ORAL | 0 refills | Status: DC | PRN

## 2016-12-29 NOTE — ED Triage Notes (Signed)
Pt reports he was working over 1 week ago when he slipped and twisted left knee. Pain has been getting worse gradually and he is not unable to bear weight on left leg.

## 2017-01-06 NOTE — ED Provider Notes (Signed)
[facility] Provider Note   CSN: [id number] Arrival date & time: 12/29/16  1530     History   Chief Complaint Chief Complaint  Patient presents with  . Knee Pain    HPI Cody Diaz is a 61 y.o. male.  HPI   61 year old male with left knee pain.  Onset about a week ago.  He slipped and fell at work.  He felt like his left knee twisted.  He was initially able to bear weight but this has become more more difficult.  Is been using a rolling walker to ambulate.  History reviewed. No pertinent past medical history.  Patient Active Problem List   Diagnosis Date Noted  . Urinary frequency 08/27/2016  . Benign prostatic hyperplasia with urinary frequency 01/12/2016  . UTI due to Klebsiella species 12/10/2015  . Bilateral knee pain 07/27/2015  . Vitamin D deficiency 05/02/2015  . Hypertension 05/01/2015  . Prediabetes 05/01/2015  . Generalized anxiety disorder 05/01/2015  . Caregiver stress 05/01/2015  . 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 Low back pain 08/10/2014    Past Surgical History:  Procedure Laterality Date  . KNEE SURGERY         Home Medications    Prior to Admission medications   Medication Sig Start Date End Date Taking? Authorizing Provider  albuterol (PROVENTIL HFA;VENTOLIN HFA) 108 (90 Base) MCG/ACT inhaler Inhale 1-2 puffs into the lungs every 6 (six) hours as needed for wheezing or shortness of breath. 09/08/16   Funches, Gerilyn NestleJosalyn, MD  cephALEXin (KEFLEX) 500 MG capsule Take 1 capsule (500 mg total) by mouth 4 (four) times daily. 09/18/16   Funches, Gerilyn NestleJosalyn, MD  cyclobenzaprine (FLEXERIL) 10 MG tablet Take 1 tablet (10 mg total) by mouth 3 (three) times daily as needed for muscle spasms. 09/25/16   Funches, Gerilyn NestleJosalyn, MD  diazepam (VALIUM) 5 MG tablet Take 1 tablet (5 mg total) by mouth every 12 (twelve) hours as needed for anxiety. 08/26/16   Funches, Gerilyn NestleJosalyn, MD  HYDROcodone-acetaminophen (NORCO/VICODIN) 5-325 MG tablet  Take 1-2 tablets every 4 (four) hours as needed by mouth. 12/29/16   Raeford RazorKohut, Pearley Millington, MD  ibuprofen (ADVIL,MOTRIN) 200 MG tablet Take 600 mg by mouth every 6 (six) hours as needed.    [provider]  losartan (COZAAR) 100 MG tablet Take 0.5 tablets (50 mg total) by mouth daily. 09/25/16   Dessa PhiFunches, Josalyn, MD  naproxen (NAPROSYN) 500 MG tablet Take 1 tablet (500 mg total) by mouth 2 (two) times daily with a meal. 09/25/16   Funches, Gerilyn NestleJosalyn, MD  tamsulosin (FLOMAX) 0.4 MG CAPS capsule Take 2 capsules (0.8 mg total) by mouth daily after supper. 08/26/16   Dessa PhiFunches, Josalyn, MD    Family History Family History  Problem Relation Age of Onset  . Diabetes Mother   . COPD Mother   . Hypertension Mother   . Heart disease Mother     Social History Social History   Tobacco Use  . Smoking status: Never Smoker  . Smokeless tobacco: Never Used  Substance Use Topics  . Alcohol use: Yes    Alcohol/week: 1.2 oz    Types: 2 Cans of beer per week    Comment: 2 beers per night   . Drug use: No     Allergies   Patient has no known allergies.   Review of Systems Review of Systems  All systems reviewed and negative, other than as noted in HPI.   Physical Exam Updated Vital Signs BP 131/87 (BP  Location: Left Arm)   Pulse 74   Temp 97.7 F (36.5 C) (Oral)   Resp 16   Wt 113.9 kg (251 lb)   SpO2 98%   BMI 38.16 kg/m    Physical Exam  Constitutional: He appears well-developed and well-nourished. No distress.  HENT:  Head: Normocephalic and atraumatic.  Eyes: Conjunctivae are normal. Right eye exhibits no discharge. Left eye exhibits no discharge.  Neck: Neck supple.  Cardiovascular: Normal rate, regular rhythm and normal heart sounds. Exam reveals no gallop and no friction rub.  No murmur heard. Pulmonary/Chest: Effort normal and breath sounds normal. No respiratory distress.  Abdominal: Soft. He exhibits no distension. There is no tenderness.  Musculoskeletal: He exhibits no  edema or tenderness.  Left knee with mild swelling.  Some pain with range of motion.  No laxity appreciated.  Neurovascular intact.  Neurological: He is alert.  Skin: Skin is warm and dry.  Psychiatric: He has a normal mood and affect. His behavior is normal. Thought content normal.  Nursing note and vitals reviewed.    ED Treatments / Results  Labs (all labs ordered are listed, but only abnormal results are displayed) Labs Reviewed - No data to display  EKG  EKG Interpretation None       Radiology No results found.  Procedures Procedures (including critical care time)  Medications Ordered in ED Medications - No data to display   Initial Impression / Assessment and Plan / ED Course  I have reviewed the triage vital signs and the nursing notes.  Pertinent labs & imaging results that were available during my care of the patient were reviewed by me and considered in my medical decision making (see chart for details).     [id number] male with left knee pain.  Possible internal derangement.  Imaging negative.  Immobilizer.  Ortho follow-up.  Has walker.  Final Clinical Impressions(s) / ED Diagnoses   Final diagnoses:  Acute pain of left knee    ED Discharge Orders        Ordered    HYDROcodone-acetaminophen (NORCO/VICODIN) 5-325 MG tablet  Every 4 hours PRN     12/29/16 1849      Raeford RazorKohut, Stephano Arrants, MD 01/06/17 1229

## 2017-02-05 MED FILL — diazePAM 5 MG TABS: 5 | 30 days supply | Qty: 60 | Fill #2

## 2017-02-05 MED FILL — NAPROXEN 500 MG TABLET: 500 | 30 days supply | Qty: 60 | Fill #2

## 2017-03-26 ENCOUNTER — Ambulatory Visit (HOSPITAL_COMMUNITY)
Admission: RE | Admit: 2017-03-26 | Discharge: 2017-03-26 | Disposition: A | Discharge status: Home / Self Care | Payer: Self-pay | Source: Ambulatory Visit | Attending: Orthopedic Surgery | Admitting: Orthopedic Surgery

## 2017-03-26 ENCOUNTER — Other Ambulatory Visit (HOSPITAL_COMMUNITY): Payer: Self-pay | Admitting: Orthopedic Surgery

## 2017-03-26 DIAGNOSIS — M79605 Pain in left leg: Secondary | ICD-10-CM | POA: Insufficient documentation

## 2017-03-26 DIAGNOSIS — M7989 Other specified soft tissue disorders: Principal | ICD-10-CM

## 2017-03-26 NOTE — Progress Notes (Signed)
LLE venous duplex prelim: negative for DVT. Farrel DemarkJill Eunice, RDMS, RVT Called results to Dr. Thurston HoleWainer.

## 2017-04-28 ENCOUNTER — Ambulatory Visit: Payer: Self-pay | Admitting: Nurse Practitioner

## 2017-06-16 ENCOUNTER — Ambulatory Visit: Payer: Self-pay | Attending: Nurse Practitioner | Admitting: Nurse Practitioner

## 2017-06-16 ENCOUNTER — Encounter: Payer: Self-pay | Admitting: Nurse Practitioner

## 2017-06-16 VITALS — BP 126/87 | HR 97 | Temp 98.4°F | Resp 14 | Ht 68.0 in | Wt 250.2 lb

## 2017-06-16 DIAGNOSIS — Z0000 Encounter for general adult medical examination without abnormal findings: Secondary | ICD-10-CM | POA: Insufficient documentation

## 2017-06-16 DIAGNOSIS — R3911 Hesitancy of micturition: Secondary | ICD-10-CM | POA: Insufficient documentation

## 2017-06-16 DIAGNOSIS — Z760 Encounter for issue of repeat prescription: Secondary | ICD-10-CM | POA: Insufficient documentation

## 2017-06-16 DIAGNOSIS — N390 Urinary tract infection, site not specified: Secondary | ICD-10-CM | POA: Insufficient documentation

## 2017-06-16 DIAGNOSIS — F5104 Psychophysiologic insomnia: Secondary | ICD-10-CM

## 2017-06-16 DIAGNOSIS — F419 Anxiety disorder, unspecified: Secondary | ICD-10-CM | POA: Insufficient documentation

## 2017-06-16 DIAGNOSIS — M545 Low back pain: Secondary | ICD-10-CM | POA: Insufficient documentation

## 2017-06-16 DIAGNOSIS — G4700 Insomnia, unspecified: Secondary | ICD-10-CM | POA: Insufficient documentation

## 2017-06-16 DIAGNOSIS — B961 Klebsiella pneumoniae [K. pneumoniae] as the cause of diseases classified elsewhere: Secondary | ICD-10-CM | POA: Insufficient documentation

## 2017-06-16 DIAGNOSIS — I10 Essential (primary) hypertension: Secondary | ICD-10-CM | POA: Insufficient documentation

## 2017-06-16 DIAGNOSIS — R0602 Shortness of breath: Secondary | ICD-10-CM | POA: Insufficient documentation

## 2017-06-16 DIAGNOSIS — Z79899 Other long term (current) drug therapy: Secondary | ICD-10-CM | POA: Insufficient documentation

## 2017-06-16 DIAGNOSIS — J301 Allergic rhinitis due to pollen: Secondary | ICD-10-CM

## 2017-06-16 DIAGNOSIS — G8929 Other chronic pain: Secondary | ICD-10-CM | POA: Insufficient documentation

## 2017-06-16 DIAGNOSIS — B9689 Other specified bacterial agents as the cause of diseases classified elsewhere: Secondary | ICD-10-CM

## 2017-06-16 DIAGNOSIS — Z818 Family history of other mental and behavioral disorders: Secondary | ICD-10-CM | POA: Insufficient documentation

## 2017-06-16 LAB — POCT URINALYSIS DIPSTICK
BILIRUBIN UA: NEGATIVE
GLUCOSE UA: NEGATIVE
Ketones, UA: NEGATIVE
Protein, UA: 30
RBC UA: NEGATIVE
SPEC GRAV UA: 1.015 (ref 1.010–1.025)
Urobilinogen, UA: 1 E.U./dL
pH, UA: 6 (ref 5.0–8.0)

## 2017-06-16 MED ORDER — CYCLOBENZAPRINE HCL 10 MG PO TABS: 10.0000 mg | ORAL_TABLET | Freq: Three times a day (TID) | ORAL | 0 refills | Status: DC | PRN

## 2017-06-16 MED ORDER — TRAZODONE HCL 100 MG PO TABS: 100.0000 mg | ORAL_TABLET | Freq: Every day | ORAL | 2 refills | Status: DC

## 2017-06-16 MED ORDER — BUSPIRONE HCL 10 MG PO TABS: 10.0000 mg | ORAL_TABLET | Freq: Three times a day (TID) | ORAL | 2 refills | Status: AC

## 2017-06-16 MED ORDER — CEPHALEXIN 500 MG PO CAPS: 500.0000 mg | ORAL_CAPSULE | Freq: Four times a day (QID) | ORAL | 0 refills | Status: DC

## 2017-06-16 MED ORDER — ALBUTEROL SULFATE HFA 108 (90 BASE) MCG/ACT IN AERS: 1.0000 | INHALATION_SPRAY | Freq: Four times a day (QID) | RESPIRATORY_TRACT | 1 refills | Status: DC | PRN

## 2017-06-16 MED FILL — CYCLOBENZAPRINE 10 MG TAB: 10 | 7 days supply | Qty: 20 | Fill #0

## 2017-06-16 MED FILL — ALBUTEROL SULFATE HFA 108 (: 108 (90 BAS | 25 days supply | Qty: 18 | Fill #0

## 2017-06-16 MED FILL — CEPHALEXIN 500 MG CAPSULE: 500 | 7 days supply | Qty: 28 | Fill #0

## 2017-06-16 MED FILL — traZODone HCL 100 MG TABS: 100 | 30 days supply | Qty: 30 | Fill #0

## 2017-06-16 NOTE — Progress Notes (Signed)
Assessment & Plan:  Cody Diaz was seen today for establish care and medication refill.  Diagnoses and all orders for this visit:  Anxiety -     busPIRone (BUSPAR) 10 MG tablet; Take 1 tablet (10 mg total) by mouth 3 (three) times daily.  UTI due to Klebsiella species -     Urinalysis Dipstick -     cephALEXin (KEFLEX) 500 MG capsule; Take 1 capsule (500 mg total) by mouth 4 (four) times daily for 7 days.  Chronic right-sided low back pain without sciatica -     cyclobenzaprine (FLEXERIL) 10 MG tablet; Take 1 tablet (10 mg total) by mouth 3 (three) times daily as needed for muscle spasms.  Urinary hesitancy -     PSA -     CULTURE, URINE COMPREHENSIVE  Routine adult health maintenance -     CBC -     Basic metabolic panel -     Lipid panel -     TSH  Allergic rhinitis due to pollen, unspecified seasonality -     albuterol (PROVENTIL HFA;VENTOLIN HFA) 108 (90 Base) MCG/ACT inhaler; Inhale 1-2 puffs into the lungs every 6 (six) hours as needed for wheezing or shortness of breath.  Psychophysiological insomnia -     traZODone (DESYREL) 100 MG tablet; Take 1 tablet (100 mg total) by mouth at bedtime.    Patient has been counseled on age-appropriate routine health concerns for screening and prevention. These are reviewed and up-to-date. Referrals have been placed accordingly. Immunizations are up-to-date or declined.    Subjective:   Chief Complaint  Patient presents with  . Establish Care    Pt. here for anxiety, and check-up.   . Medication Refill   HPI Raynor Calcaterra 62 y.o. male presents to office today to establish care. He has a history of recurrent UTI, urinary retention and anxiety.  He has a history of hypertension and has in the past taken losartan 50-100 mg daily however he was taken off of Cozaar last year due to low normal blood pressure readings.  He is normotensive today. Wll continue to monitor at future office visit.  Anxiety He has been prescribed Valium over  the past few years.  He endorses only taking it sparingly however I have instructed him today that will not be able to refill  valium which was previously prescribed by his former PCP.  [place]set of anxiety[place] of his ailing mother here in [place], [place].  He has the following symptoms: difficulty concentrating, feelings of losing control, insomnia, palpitations, racing thoughts, shortness of breath, sweating. He denies current suicidal and homicidal ideation. Family history significant for anxiety and depression.Possible organic causes contributing are: none. Risk factors: negative life event mother has chronic medical conditions Previous treatment includes Lexapro and Valium and none.    Urinary Symptoms Chronic and recurring.  Patient complains of frequency, hesitancy, incomplete bladder emptying, nocturia and urgency  Patient also complains of back pain. Patient denies fever. Patient does have a history of recurrent UTI.  Patient does not have a history of pyelonephritis.  He was prescribed Keflex in July 2018 however today he has a full pill bottle containing Keflex and flomax here in the office.  He reports his brother who lives out of town somehow traveled back home last year with his medication and just returned the pill bottles to him today. His strength to discard the previous Keflex and based on urine dipstick  results today will re-prescribe Keflex and obtain comprehensive urine culture as well as place urology referral once he has been approved through the financial assistance  program.   Review of Systems  Constitutional: Negative for fever, malaise/fatigue and weight loss.  HENT: Negative.  Negative for nosebleeds.   Eyes: Negative.  Negative for blurred vision, double vision and photophobia.  Respiratory: Positive for shortness of breath and wheezing. Negative for cough.   Cardiovascular: Negative.  Negative for chest pain,  palpitations and leg swelling.  Gastrointestinal: Negative.  Negative for heartburn, nausea and vomiting.  Genitourinary: Positive for frequency and urgency. Negative for dysuria, flank pain and hematuria.       See HPI  Musculoskeletal: Positive for back pain. Negative for myalgias.  Neurological: Negative.  Negative for dizziness, focal weakness, seizures and headaches.  Endo/Heme/Allergies: Positive for environmental allergies.  Psychiatric/Behavioral: Negative for suicidal ideas. The patient is nervous/anxious and has insomnia.     Past Medical History:  Diagnosis Date  . Anxiety   . Hypertension     Past Surgical History:  Procedure Laterality Date  . KNEE SURGERY      Family History  Problem Relation Age of Onset  . Diabetes Mother   . COPD Mother   . Hypertension Mother   . Heart disease Mother   . Kidney failure Mother     Social History Reviewed with no changes to be made today.   Outpatient Medications Prior to Visit  Medication Sig Dispense Refill  . albuterol (PROVENTIL HFA;VENTOLIN HFA) 108 (90 Base) MCG/ACT inhaler Inhale 1-2 puffs into the lungs every 6 (six) hours as needed for wheezing or shortness of breath. 54 Inhaler 3  . ibuprofen (ADVIL,MOTRIN) 200 MG tablet Take 600 mg by mouth every 6 (six) hours as needed.    . tamsulosin (FLOMAX) 0.4 MG CAPS capsule Take 2 capsules (0.8 mg total) by mouth daily after supper. (Patient not taking: Reported on 06/16/2017) 60 capsule 5  . cephALEXin (KEFLEX) 500 MG capsule Take 1 capsule (500 mg total) by mouth 4 (four) times daily. (Patient not taking: Reported on 06/16/2017) 28 capsule 0  . cyclobenzaprine (FLEXERIL) 10 MG tablet Take 1 tablet (10 mg total) by mouth 3 (three) times daily as needed for muscle spasms. (Patient not taking: Reported on 06/16/2017) 20 tablet 0  . diazepam (VALIUM) 5 MG tablet Take 1 tablet (5 mg total) by mouth every 12 (twelve) hours as needed for anxiety. (Patient not taking: Reported on  06/16/2017) 60 tablet 2  . HYDROcodone-acetaminophen (NORCO/VICODIN) 5-325 MG tablet Take 1-2 tablets every 4 (four) hours as needed by mouth. (Patient not taking: Reported on 06/16/2017) 12 tablet 0  . losartan (COZAAR) 100 MG tablet Take 0.5 tablets (50 mg total) by mouth daily. (Patient not taking: Reported on 06/16/2017) 30 tablet 11  . naproxen (NAPROSYN) 500 MG tablet Take 1 tablet (500 mg total) by mouth 2 (two) times daily with a meal. (Patient not taking: Reported on 06/16/2017) 60 tablet 2   No facility-administered medications prior to visit.     No Known Allergies     Objective:    BP 126/87 (BP Location: Left Arm, Patient Position: Sitting, Cuff Size: Normal)   Pulse 97   Temp 98.4 F (36.9 C) (Oral)   Resp 14   Ht 5\' 8"  (1.727 m)   Wt 250 lb 3.2 oz (113.5 kg)   SpO2 96%   BMI 38.04 kg/m   Wt Readings from Last 3 Encounters:  06/16/17 250  lb 3.2 oz (113.5 kg)  12/29/16 251 lb (113.9 kg)  09/25/16 251 lb (113.9 kg)    Physical Exam  Constitutional: He is oriented to person, place, and time. He appears well-developed and well-nourished. He is cooperative.  HENT:  Head: Normocephalic and atraumatic.  Eyes: EOM are normal.  Neck: Normal range of motion.  Cardiovascular: Normal rate, regular rhythm and normal heart sounds. Exam reveals no gallop and no friction rub.  No murmur heard. Pulmonary/Chest: Effort normal. Tachypnea noted. No respiratory distress. He has no decreased breath sounds. He has wheezes in the right middle field, the right lower field, the left middle field and the left lower field. He has no rhonchi. He has no rales. He exhibits no tenderness.  Abdominal: Bowel sounds are normal.  Musculoskeletal: Normal range of motion. He exhibits no edema.       Lumbar back: He exhibits pain and spasm.  Neurological: He is alert and oriented to person, place, and time. Coordination normal.  Skin: Skin is warm and dry.  Psychiatric: His speech is normal. Judgment  and thought content normal. His mood appears anxious. He is hyperactive. Cognition and memory are normal. He expresses no suicidal plans and no homicidal plans.  Nursing note and vitals reviewed.      Patient has been counseled extensively about nutrition and exercise as well as the importance of adherence with medications and regular follow-up. The patient was given clear instructions to go to ER or return to medical center if symptoms don't improve, worsen or new problems develop. The patient verbalized understanding.   Follow-up: Return in about 1 month (around 07/14/2017) for f/u anxiety, UTI.   Claiborne RiggZelda W Fleming, FNP-BC [facility] [place], [place] [phone]   06/16/2017, 5:29 PM

## 2017-06-16 NOTE — Patient Instructions (Signed)

## 2017-06-17 LAB — BASIC METABOLIC PANEL
BUN/Creatinine Ratio: 15 (ref 10–24)
BUN: 14 mg/dL (ref 8–27)
CO2: 26 mmol/L (ref 20–29)
CREATININE: 0.91 mg/dL (ref 0.76–1.27)
Calcium: 10.2 mg/dL (ref 8.6–10.2)
Chloride: 98 mmol/L (ref 96–106)
GFR, EST AFRICAN AMERICAN: 105 mL/min/{1.73_m2} (ref >59)
GFR, EST NON AFRICAN AMERICAN: 91 mL/min/{1.73_m2} (ref >59)
Glucose: 110 mg/dL — ABNORMAL HIGH (ref 65–99)
Potassium: 4.2 mmol/L (ref 3.5–5.2)
Sodium: 140 mmol/L (ref 134–144)

## 2017-06-17 LAB — CBC
HEMOGLOBIN: 15.9 g/dL (ref 13.0–17.7)
Hematocrit: 47.7 % (ref 37.5–51.0)
MCH: 32.3 pg (ref 26.6–33.0)
MCHC: 33.3 g/dL (ref 31.5–35.7)
MCV: 97 fL (ref 79–97)
Platelets: 237 10*3/uL (ref 150–379)
RBC: 4.92 x10E6/uL (ref 4.14–5.80)
RDW: 13.7 % (ref 12.3–15.4)
WBC: 9.4 10*3/uL (ref 3.4–10.8)

## 2017-06-17 LAB — LIPID PANEL
Chol/HDL Ratio: 4.4 ratio (ref 0.0–5.0)
Cholesterol, Total: 226 mg/dL — ABNORMAL HIGH (ref 100–199)
HDL: 51 mg/dL (ref >39)
LDL CALC: 133 mg/dL — AB (ref 0–99)
Triglycerides: 209 mg/dL — ABNORMAL HIGH (ref 0–149)
VLDL CHOLESTEROL CAL: 42 mg/dL — AB (ref 5–40)

## 2017-06-17 LAB — PSA: Prostate Specific Ag, Serum: 6.5 ng/mL — ABNORMAL HIGH (ref 0.0–4.0)

## 2017-06-17 LAB — TSH: TSH: 1.31 u[IU]/mL (ref 0.450–4.500)

## 2017-06-19 ENCOUNTER — Other Ambulatory Visit: Payer: Self-pay | Admitting: Nurse Practitioner

## 2017-06-19 ENCOUNTER — Telehealth: Payer: Self-pay

## 2017-06-19 DIAGNOSIS — N390 Urinary tract infection, site not specified: Secondary | ICD-10-CM

## 2017-06-19 NOTE — Telephone Encounter (Signed)
-----   Message from Zelda W Fleming, NP sent at 06/19/2017  1:03 AM EDT ----- Still awaiting additional culture results. I have also referred you to Urology due to elevated PSA. Prostates Specific Antigen or prostate level 

## 2017-06-19 NOTE — Telephone Encounter (Signed)
Patient returned CMA called and was inform on lab results and referral.    Patient understood someone will reach out to him regarding his Urology referral.

## 2017-06-19 NOTE — Telephone Encounter (Signed)
CMA attempt to call patient regarding results and referral.  No answer and left a VM for patient to call back.  If patient call back, please inform:  Still awaiting additional culture results. I have also referred you to Urology due to elevated PSA. Prostates Specific Antigen or prostate level

## 2017-06-19 NOTE — Telephone Encounter (Signed)
-----   Message from Claiborne RiggZelda W Fleming, NP sent at 06/19/2017  1:03 AM EDT ----- Still awaiting additional culture results. I have also referred you to Urology due to elevated PSA. Prostates Specific Antigen or prostate level

## 2017-06-22 ENCOUNTER — Ambulatory Visit: Payer: Self-pay | Attending: Family Medicine

## 2017-06-22 ENCOUNTER — Other Ambulatory Visit: Payer: Self-pay | Admitting: Nurse Practitioner

## 2017-06-22 LAB — CULTURE, URINE COMPREHENSIVE

## 2017-06-22 MED ORDER — CIPROFLOXACIN HCL 500 MG PO TABS: 500.0000 mg | ORAL_TABLET | Freq: Two times a day (BID) | ORAL | 0 refills | Status: AC

## 2017-06-23 ENCOUNTER — Telehealth: Payer: Self-pay

## 2017-06-23 MED FILL — CIPROFLOXACIN HCL 500 MG TA: 500 | 7 days supply | Qty: 14 | Fill #0

## 2017-06-23 NOTE — Telephone Encounter (Signed)
-----   Message from Claiborne Rigg, NP sent at 06/22/2017  9:37 PM EDT ----- I have sent in a new antibiotic for you to start taking. Your cultures from your urine show several other antibiotics that are more effective at treating your infection. Please stop the keflex.

## 2017-06-23 NOTE — Telephone Encounter (Signed)
CMA attempt to call patient to inform on lab results and new Rx.  No answer and left a VM for patient to call back. If patient call back, please inform:  I have sent in a new antibiotic for you to start taking. Your cultures from your urine show several other antibiotics that are more effective at treating your infection. Please stop the keflex.

## 2017-06-25 ENCOUNTER — Ambulatory Visit: Payer: Self-pay

## 2017-07-15 MED FILL — TAMSULOSIN HCL 0.4 MG CAP: 0.4 | 30 days supply | Qty: 60 | Fill #3

## 2017-07-29 ENCOUNTER — Encounter (INDEPENDENT_AMBULATORY_CARE_PROVIDER_SITE_OTHER): Payer: Self-pay | Admitting: Orthopaedic Surgery

## 2017-07-29 ENCOUNTER — Ambulatory Visit (INDEPENDENT_AMBULATORY_CARE_PROVIDER_SITE_OTHER): Payer: Self-pay | Admitting: Orthopaedic Surgery

## 2017-07-29 VITALS — BP 159/93 | HR 63 | Ht 68.0 in | Wt 240.0 lb

## 2017-07-29 MED FILL — !VENTOLIN HFA INHALER: 108 (90 BAS | 25 days supply | Qty: 18 | Fill #1

## 2017-07-31 ENCOUNTER — Ambulatory Visit: Payer: Self-pay | Attending: Nurse Practitioner | Admitting: Nurse Practitioner

## 2017-07-31 ENCOUNTER — Encounter: Payer: Self-pay | Admitting: Nurse Practitioner

## 2017-07-31 VITALS — BP 120/78 | HR 81 | Temp 98.2°F | Resp 18 | Ht 68.0 in | Wt 244.0 lb

## 2017-07-31 DIAGNOSIS — F419 Anxiety disorder, unspecified: Secondary | ICD-10-CM | POA: Insufficient documentation

## 2017-07-31 DIAGNOSIS — Z79899 Other long term (current) drug therapy: Secondary | ICD-10-CM | POA: Insufficient documentation

## 2017-07-31 DIAGNOSIS — R972 Elevated prostate specific antigen [PSA]: Secondary | ICD-10-CM

## 2017-07-31 DIAGNOSIS — R8290 Unspecified abnormal findings in urine: Secondary | ICD-10-CM

## 2017-07-31 DIAGNOSIS — I10 Essential (primary) hypertension: Secondary | ICD-10-CM | POA: Insufficient documentation

## 2017-07-31 NOTE — Patient Instructions (Signed)
Calorie Counting for Weight Loss Calories are units of energy. Your body needs a certain amount of calories from food to keep you going throughout the day. When you eat more calories than your body needs, your body stores the extra calories as fat. When you eat fewer calories than your body needs, your body burns fat to get the energy it needs. Calorie counting means keeping track of how many calories you eat and drink each day. Calorie counting can be helpful if you need to lose weight. If you make sure to eat fewer calories than your body needs, you should lose weight. Ask your health care provider what a healthy weight is for you. For calorie counting to work, you will need to eat the right number of calories in a day in order to lose a healthy amount of weight per week. A dietitian can help you determine how many calories you need in a day and will give you suggestions on how to reach your calorie goal.  A healthy amount of weight to lose per week is usually 1-2 lb (0.5-0.9 kg). This usually means that your daily calorie intake should be reduced by 500-750 calories.  Eating 1,200 - 1,500 calories per day can help most women lose weight.  Eating 1,500 - 1,800 calories per day can help most men lose weight.  What is my plan? My goal is to have __________ calories per day. If I have this many calories per day, I should lose around __________ pounds per week. What do I need to know about calorie counting? In order to meet your daily calorie goal, you will need to:  Find out how many calories are in each food you would like to eat. Try to do this before you eat.  Decide how much of the food you plan to eat.  Write down what you ate and how many calories it had. Doing this is called keeping a food log.  To successfully lose weight, it is important to balance calorie counting with a healthy lifestyle that includes regular activity. Aim for 150 minutes of moderate exercise (such as walking) or 75  minutes of vigorous exercise (such as running) each week. Where do I find calorie information?  The number of calories in a food can be found on a Nutrition Facts label. If a food does not have a Nutrition Facts label, try to look up the calories online or ask your dietitian for help. Remember that calories are listed per serving. If you choose to have more than one serving of a food, you will have to multiply the calories per serving by the amount of servings you plan to eat. For example, the label on a package of bread might say that a serving size is 1 slice and that there are 90 calories in a serving. If you eat 1 slice, you will have eaten 90 calories. If you eat 2 slices, you will have eaten 180 calories. How do I keep a food log? Immediately after each meal, record the following information in your food log:  What you ate. Don't forget to include toppings, sauces, and other extras on the food.  How much you ate. This can be measured in cups, ounces, or number of items.  How many calories each food and drink had.  The total number of calories in the meal.  Keep your food log near you, such as in a small notebook in your pocket, or use a mobile app or website. Some   programs will calculate calories for you and show you how many calories you have left for the day to meet your goal. What are some calorie counting tips?  Use your calories on foods and drinks that will fill you up and not leave you hungry: ? Some examples of foods that fill you up are nuts and nut butters, vegetables, lean proteins, and high-fiber foods like whole grains. High-fiber foods are foods with more than 5 g fiber per serving. ? Drinks such as sodas, specialty coffee drinks, alcohol, and juices have a lot of calories, yet do not fill you up.  Eat nutritious foods and avoid empty calories. Empty calories are calories you get from foods or beverages that do not have many vitamins or protein, such as candy, sweets, and  soda. It is better to have a nutritious high-calorie food (such as an avocado) than a food with few nutrients (such as a bag of chips).  Know how many calories are in the foods you eat most often. This will help you calculate calorie counts faster.  Pay attention to calories in drinks. Low-calorie drinks include water and unsweetened drinks.  Pay attention to nutrition labels for "low fat" or "fat free" foods. These foods sometimes have the same amount of calories or more calories than the full fat versions. They also often have added sugar, starch, or salt, to make up for flavor that was removed with the fat.  Find a way of tracking calories that works for you. Get creative. Try different apps or programs if writing down calories does not work for you. What are some portion control tips?  Know how many calories are in a serving. This will help you know how many servings of a certain food you can have.  Use a measuring cup to measure serving sizes. You could also try weighing out portions on a kitchen scale. With time, you will be able to estimate serving sizes for some foods.  Take some time to put servings of different foods on your favorite plates, bowls, and cups so you know what a serving looks like.  Try not to eat straight from a bag or box. Doing this can lead to overeating. Put the amount you would like to eat in a cup or on a plate to make sure you are eating the right portion.  Use smaller plates, glasses, and bowls to prevent overeating.  Try not to multitask (for example, watch TV or use your computer) while eating. If it is time to eat, sit down at a table and enjoy your food. This will help you to know when you are full. It will also help you to be aware of what you are eating and how much you are eating. What are tips for following this plan? Reading food labels  Check the calorie count compared to the serving size. The serving size may be smaller than what you are used to  eating.  Check the source of the calories. Make sure the food you are eating is high in vitamins and protein and low in saturated and trans fats. Shopping  Read nutrition labels while you shop. This will help you make healthy decisions before you decide to purchase your food.  Make a grocery list and stick to it. Cooking  Try to cook your favorite foods in a healthier way. For example, try baking instead of frying.  Use low-fat dairy products. Meal planning  Use more fruits and vegetables. Half of your plate should   be fruits and vegetables.  Include lean proteins like poultry and fish. How do I count calories when eating out?  Ask for smaller portion sizes.  Consider sharing an entree and sides instead of getting your own entree.  If you get your own entree, eat only half. Ask for a box at the beginning of your meal and put the rest of your entree in it so you are not tempted to eat it.  If calories are listed on the menu, choose the lower calorie options.  Choose dishes that include vegetables, fruits, whole grains, low-fat dairy products, and lean protein.  Choose items that are boiled, broiled, grilled, or steamed. Stay away from items that are buttered, battered, fried, or served with cream sauce. Items labeled "crispy" are usually fried, unless stated otherwise.  Choose water, low-fat milk, unsweetened iced tea, or other drinks without added sugar. If you want an alcoholic beverage, choose a lower calorie option such as a glass of wine or light beer.  Ask for dressings, sauces, and syrups on the side. These are usually high in calories, so you should limit the amount you eat.  If you want a salad, choose a garden salad and ask for grilled meats. Avoid extra toppings like bacon, cheese, or fried items. Ask for the dressing on the side, or ask for olive oil and vinegar or lemon to use as dressing.  Estimate how many servings of a food you are given. For example, a serving of  cooked rice is  cup or about the size of half a baseball. Knowing serving sizes will help you be aware of how much food you are eating at restaurants. The list below tells you how big or small some common portion sizes are based on everyday objects: ? 1 oz-4 stacked dice. ? 3 oz-1 deck of cards. ? 1 tsp-1 die. ? 1 Tbsp- a ping-pong ball. ? 2 Tbsp-1 ping-pong ball. ?  cup- baseball. ? 1 cup-1 baseball. Summary  Calorie counting means keeping track of how many calories you eat and drink each day. If you eat fewer calories than your body needs, you should lose weight.  A healthy amount of weight to lose per week is usually 1-2 lb (0.5-0.9 kg). This usually means reducing your daily calorie intake by 500-750 calories.  The number of calories in a food can be found on a Nutrition Facts label. If a food does not have a Nutrition Facts label, try to look up the calories online or ask your dietitian for help.  Use your calories on foods and drinks that will fill you up, and not on foods and drinks that will leave you hungry.  Use smaller plates, glasses, and bowls to prevent overeating. This information is not intended to replace advice given to you by your health care provider. Make sure you discuss any questions you have with your health care provider. Document Released: 02/10/2005 Document Revised: 01/11/2016 Document Reviewed: 01/11/2016 Elsevier Interactive Patient Education  2018 [employer]  Exercising to Lose Weight Exercising can help you to lose weight. In order to lose weight through exercise, you need to do vigorous-intensity exercise. You can tell that you are exercising with vigorous intensity if you are breathing very hard and fast and cannot hold a conversation while exercising. Moderate-intensity exercise helps to maintain your current weight. You can tell that you are exercising at a moderate level if you have a higher heart rate and faster breathing, but you are still able  to   hold a conversation. How often should I exercise? Choose an activity that you enjoy and set realistic goals. Your health care provider can help you to make an activity plan that works for you. Exercise regularly as directed by your health care provider. This may include:  Doing resistance training twice each week, such as: ? Push-ups. ? Sit-ups. ? Lifting weights. ? Using resistance bands.  Doing a given intensity of exercise for a given amount of time. Choose from these options: ? 150 minutes of moderate-intensity exercise every week. ? 75 minutes of vigorous-intensity exercise every week. ? A mix of moderate-intensity and vigorous-intensity exercise every week.  Children, pregnant women, people who are out of shape, people who are overweight, and older adults may need to consult a health care provider for individual recommendations. If you have any sort of medical condition, be sure to consult your health care provider before starting a new exercise program. What are some activities that can help me to lose weight?  Walking at a rate of at least 4.5 miles an hour.  Jogging or running at a rate of 5 miles per hour.  Biking at a rate of at least 10 miles per hour.  Lap swimming.  Roller-skating or in-line skating.  Cross-country skiing.  Vigorous competitive sports, such as football, basketball, and soccer.  Jumping rope.  Aerobic dancing. How can I be more active in my day-to-day activities?  Use the stairs instead of the elevator.  Take a walk during your lunch break.  If you drive, park your car farther away from work or school.  If you take public transportation, get off one stop early and walk the rest of the way.  Make all of your phone calls while standing up and walking around.  Get up, stretch, and walk around every 30 minutes throughout the day. What guidelines should I follow while exercising?  Do not exercise so much that you hurt yourself, feel dizzy,  or get very short of breath.  Consult your health care provider prior to starting a new exercise program.  Wear comfortable clothes and shoes with good support.  Drink plenty of water while you exercise to prevent dehydration or heat stroke. Body water is lost during exercise and must be replaced.  Work out until you breathe faster and your heart beats faster. This information is not intended to replace advice given to you by your health care provider. Make sure you discuss any questions you have with your health care provider. Document Released: 03/15/2010 Document Revised: 07/19/2015 Document Reviewed: 07/14/2013 Elsevier Interactive Patient Education  2018 [employer].  Preventing Unhealthy [employer], Adult Staying at a healthy weight is important. When fat builds up in your body, you may become overweight or obese. These conditions put you at greater risk for developing certain health problems, such as heart disease, diabetes, sleeping problems, joint problems, and some cancers. Unhealthy weight gain is often the result of making unhealthy choices in what you eat. It is also a result of not getting enough exercise. You can make changes to your lifestyle to prevent obesity and stay as healthy as possible. What nutrition changes can be made? To maintain a healthy weight and prevent obesity:  Eat only as much as your body needs. To do this: ? Pay attention to signs that you are hungry or full. Stop eating as soon as you feel full. ? If you feel hungry, try drinking water first. Drink enough water so your urine is clear or  pale yellow. ? Eat smaller portions. ? Look at serving sizes on food labels. Most foods contain more than one serving per container. ? Eat the recommended amount of calories for your gender and activity level. While most active people should eat around 2,000 calories per day, if you are trying to lose weight or are not very active, you main need to eat less calories.  Talk to your health care provider or dietitian about how many calories you should eat each day.  Choose healthy foods, such as: ? Fruits and vegetables. Try to fill at least half of your plate at each meal with fruits and vegetables. ? Whole grains, such as whole wheat bread, brown rice, and quinoa. ? Lean meats, such as chicken or fish. ? Other healthy proteins, such as beans, eggs, or tofu. ? Healthy fats, such as nuts, seeds, fatty fish, and olive oil. ? Low-fat or fat-free dairy.  Check food labels and avoid food and drinks that: ? Are high in calories. ? Have added sugar. ? Are high in sodium. ? Have saturated fats or trans fats.  Limit how much you eat of the following foods: ? Prepackaged meals. ? Fast food. ? Fried foods. ? Processed meat, such as bacon, sausage, and deli meats. ? Fatty cuts of red meat and poultry with skin.  Cook foods in healthier ways, such as by baking, broiling, or grilling.  When grocery shopping, try to shop around the outside of the store. This helps you buy mostly fresh foods and avoid canned and prepackaged foods.  What lifestyle changes can be made?  Exercise at least 30 minutes 5 or more days each week. Exercising includes brisk walking, yard work, biking, running, swimming, and team sports like basketball and soccer. Ask your health care provider which exercises are safe for you.  Do not use any products that contain nicotine or tobacco, such as cigarettes and e-cigarettes. If you need help quitting, ask your health care provider.  Limit alcohol intake to no more than 1 drink a day for nonpregnant women and 2 drinks a day for men. One drink equals 12 oz of beer, 5 oz of wine, or 1 oz of hard liquor.  Try to get 7-9 hours of sleep each night. What other changes can be made?  Keep a food and activity journal to keep track of: ? What you ate and how many calories you had. Remember to count sauces, dressings, and side dishes. ? Whether you  were active, and what exercises you did. ? Your calorie, weight, and activity goals.  Check your weight regularly. Track any changes. If you notice you have gained weight, make changes to your diet or activity routine.  Avoid taking weight-loss medicines or supplements. Talk to your health care provider before starting any new medicine or supplement.  Talk to your health care provider before trying any new diet or exercise plan. Why are these changes important? Eating healthy, staying active, and having healthy habits not only help prevent obesity, they also:  Help you to manage stress and emotions.  Help you to connect with friends and family.  Improve your self-esteem.  Improve your sleep.  Prevent long-term health problems.  What can happen if changes are not made? Being obese or overweight can cause you to develop joint or bone problems, which can make it hard for you to stay active or do activities you enjoy. Being obese or overweight also puts stress on your heart and lungs and can lead  to health problems like diabetes, heart disease, and some cancers. Where to find more information: Talk with your health care provider or a dietitian about healthy eating and healthy lifestyle choices. You may also find other information through these resources:  [place] Department of Agriculture MyPlate: https://ball-collins.biz/www.choosemyplate.gov  American Heart Association: www.heart.org  Centers for Disease Control and Prevention: FootballExhibition.com.brwww.cdc.gov  Summary  Staying at a healthy weight is important. It helps prevent certain diseases and health problems, such as heart disease, diabetes, joint problems, sleep disorders, and some cancers.  Being obese or overweight can cause you to develop joint or bone problems, which can make it hard for you to stay active or do activities you enjoy.  You can prevent unhealthy weight gain by eating a healthy diet, exercising regularly, not smoking, limiting alcohol, and getting enough  sleep.  Talk with your health care provider or a dietitian for guidance about healthy eating and healthy lifestyle choices. This information is not intended to replace advice given to you by your health care provider. Make sure you discuss any questions you have with your health care provider. Document Released: 02/12/2016 Document Revised: 03/19/2016 Document Reviewed: 03/19/2016 Elsevier Interactive Patient Education  2018 [employer] Serving Sizes A serving size is a measured amount of food or drink, such as one slice of bread, that has an associated nutrient content. Knowing the serving size of a food or drink can help you determine how much of that food you should consume. What is the size of one serving? The size of one healthy serving depends on the food or drink. To determine a serving size, read the food label. If the food or drink does not have a food label, try to find serving size information online. Or, use the following to estimate the size of one adult serving: Grain 1 slice bread.  bagel.  cup pasta. Vegetable  cup cooked or canned vegetables. 1 cup raw, leafy greens. Fruit  cup canned fruit. 1 medium fruit.  cup dried fruit. Meat and Other Protein Sources 1 oz meat, poultry, or fish.  cup cooked beans. 1 egg.  cup nuts or seeds. 1 Tbsp nut butter.  cup tofu or tempeh. 2 Tbsp hummus. Dairy An individual container of yogurt (6-8 oz). 1 piece of cheese the size of your thumb (1 oz). 1 cup (8 oz) milk or milk alternative. Fat A piece the size of one dice. 1 tsp soft margarine. 1 Tbsp mayonnaise. 1 tsp vegetable oil. 1 Tbsp regular salad dressing. 2 Tbsp low-fat salad dressing. How many servings should I eat from each food group each day? The following are the suggested number of servings to try and have every day from each food group. You can also look at your eating throughout the week and aim for meeting these requirements on most days for overall healthy  eating. Grain 6-8 servings. Try to have half of your grains from whole grains, such as whole wheat bread, corn tortillas, oatmeal, brown rice, whole wheat pasta, and bulgur. Vegetable At least 2-3 servings. Fruit 2 servings. Meat and Other Protein Foods 5-6 servings. Aim to have lean proteins, such as chicken, [place], fish, beans, or tofu. Dairy 3 servings. Choose low-fat or nonfat if you are trying to control your weight. Fat 2-3 servings. Is a serving the same thing as a portion? No. A portion is the actual amount you eat, which may be more than one serving. Knowing the specific serving size of a food and the nutritional information that  goes with it can help you make a healthy decision on what size portion to eat. What are some tips to help me learn healthy serving sizes?  Check food labels for serving sizes. Many foods that come as a single portion actually contain multiple servings.  Determine the serving size of foods you commonly eat and figure out how large a portion you usually eat.  Measure the number of servings that can be held by the bowls, glasses, cups, and plates you typically use. For example, pour your breakfast cereal into your regular bowl and then pour it into a measuring cup.  For 1-2 days, measure the serving sizes of all the foods you eat.  Practice estimating serving sizes and determining how big your portions should be. This information is not intended to replace advice given to you by your health care provider. Make sure you discuss any questions you have with your health care provider. Document Released: 11/09/2002 Document Revised: 10/06/2015 Document Reviewed: 05/10/2013 Elsevier Interactive Patient Education  [employer].

## 2017-07-31 NOTE — Progress Notes (Signed)
Assessment & Plan:  Cody Diaz was seen today for follow-up.  Diagnoses and all orders for this visit:  Abnormal urinalysis -     CULTURE, URINE COMPREHENSIVE -     Urinalysis, Complete  Abnormal PSA -     PSA    Patient has been counseled on age-appropriate routine health concerns for screening and prevention. These are reviewed and up-to-date. Referrals have been placed accordingly. Immunizations are up-to-date or declined.    Subjective:   Chief Complaint  Patient presents with  . Follow-up   HPI Cody Diaz 62 y.o. male presents to office today for follow up to UTI. He currently denies any symptoms aside from urinary frequency which is likely related to his flomax which was prescribed after his PSA was noted to be elevated. Will repeat UA/culture and obtain PSA as he has not followed up with Urology since I referred him a few months ago for elevated PSA. He states he has not spoken with the financial counselor and needs to make an appointment.   Review of Systems  Constitutional: Negative for fever, malaise/fatigue and weight loss.  HENT: Negative.  Negative for nosebleeds.   Eyes: Negative.  Negative for blurred vision, double vision and photophobia.  Respiratory: Negative.  Negative for cough and shortness of breath.   Cardiovascular: Negative.  Negative for chest pain, palpitations and leg swelling.  Gastrointestinal: Negative.  Negative for heartburn, nausea and vomiting.  Genitourinary: Positive for frequency. Negative for dysuria, flank pain, hematuria and urgency.  Musculoskeletal: Negative.  Negative for myalgias.  Neurological: Negative.  Negative for dizziness, focal weakness, seizures and headaches.  Psychiatric/Behavioral: Negative.  Negative for suicidal ideas.    Past Medical History:  Diagnosis Date  . Anxiety   . Hypertension     Past Surgical History:  Procedure Laterality Date  . KNEE SURGERY      Family History  Problem Relation Age of Onset    . Diabetes Mother   . COPD Mother   . Hypertension Mother   . Heart disease Mother   . Kidney failure Mother     Social History Reviewed with no changes to be made today.   Outpatient Medications Prior to Visit  Medication Sig Dispense Refill  . albuterol (PROVENTIL HFA;VENTOLIN HFA) 108 (90 Base) MCG/ACT inhaler Inhale 1-2 puffs into the lungs every 6 (six) hours as needed for wheezing or shortness of breath. (Patient not taking: Reported on 07/29/2017) 1 Inhaler 1  . cyclobenzaprine (FLEXERIL) 10 MG tablet Take 1 tablet (10 mg total) by mouth 3 (three) times daily as needed for muscle spasms. (Patient not taking: Reported on 07/29/2017) 20 tablet 0  . ibuprofen (ADVIL,MOTRIN) 200 MG tablet Take 600 mg by mouth every 6 (six) hours as needed.    . tamsulosin (FLOMAX) 0.4 MG CAPS capsule Take 2 capsules (0.8 mg total) by mouth daily after supper. 60 capsule 5  . traZODone (DESYREL) 100 MG tablet Take 1 tablet (100 mg total) by mouth at bedtime. (Patient not taking: Reported on 07/29/2017) 30 tablet 2   No facility-administered medications prior to visit.     No Known Allergies     Objective:    BP 120/78 (BP Location: Left Arm, Patient Position: Sitting, Cuff Size: Large)   Pulse 81   Temp 98.2 F (36.8 C) (Oral)   Resp 18   Ht 5\' 8"  (1.727 m)   Wt 244 lb (110.7 kg)   SpO2 97%   BMI 37.10 kg/m  Wt Readings from  Last 3 Encounters:  07/31/17 244 lb (110.7 kg)  07/29/17 240 lb (108.9 kg)  06/16/17 250 lb 3.2 oz (113.5 kg)    Physical Exam  Constitutional: He is oriented to person, place, and time. He appears well-developed and well-nourished. He is cooperative.  HENT:  Head: Normocephalic and atraumatic.  Eyes: EOM are normal.  Neck: Normal range of motion.  Cardiovascular: Normal rate, regular rhythm and normal heart sounds. Exam reveals no gallop and no friction rub.  No murmur heard. Pulmonary/Chest: Effort normal and breath sounds normal. No tachypnea. No respiratory  distress. He has no decreased breath sounds. He has no wheezes. He has no rhonchi. He has no rales. He exhibits no tenderness.  Abdominal: Bowel sounds are normal. There is no CVA tenderness.  Musculoskeletal: Normal range of motion. He exhibits no edema.  Neurological: He is alert and oriented to person, place, and time. Coordination normal.  Skin: Skin is warm and dry.  Psychiatric: He has a normal mood and affect. His behavior is normal. Judgment and thought content normal.  Nursing note and vitals reviewed.      Patient has been counseled extensively about nutrition and exercise as well as the importance of adherence with medications and regular follow-up. The patient was given clear instructions to go to ER or return to medical center if symptoms don't improve, worsen or new problems develop. The patient verbalized understanding.   Follow-up: Return in about 6 months (around 01/30/2018).   Claiborne RiggZelda W Raymona Boss, FNP-BC [facility] [place] , [place] [phone]   07/31/2017, 4:23 PM

## 2017-08-01 LAB — MICROSCOPIC EXAMINATION
CASTS: NONE SEEN /LPF
EPITHELIAL CELLS (NON RENAL): NONE SEEN /HPF (ref 0–10)

## 2017-08-01 LAB — URINALYSIS, COMPLETE
Bilirubin, UA: NEGATIVE
Glucose, UA: NEGATIVE
Ketones, UA: NEGATIVE
Nitrite, UA: POSITIVE — AB
PH UA: 5 (ref 5.0–7.5)
Protein, UA: NEGATIVE
RBC, UA: NEGATIVE
Specific Gravity, UA: 1.022 (ref 1.005–1.030)
Urobilinogen, Ur: 0.2 mg/dL (ref 0.2–1.0)

## 2017-08-01 LAB — PSA: Prostate Specific Ag, Serum: 7.2 ng/mL — ABNORMAL HIGH (ref 0.0–4.0)

## 2017-08-03 LAB — CULTURE, URINE COMPREHENSIVE

## 2017-08-03 NOTE — Progress Notes (Signed)
Visit cancelled.

## 2017-08-04 ENCOUNTER — Telehealth: Payer: Self-pay | Admitting: Nurse Practitioner

## 2017-08-04 ENCOUNTER — Other Ambulatory Visit: Payer: Self-pay | Admitting: Nurse Practitioner

## 2017-08-04 ENCOUNTER — Telehealth: Payer: Self-pay

## 2017-08-04 MED ORDER — CIPROFLOXACIN HCL 500 MG PO TABS: 500.0000 mg | ORAL_TABLET | Freq: Two times a day (BID) | ORAL | 0 refills | Status: AC

## 2017-08-04 MED FILL — CIPROFLOXACIN HCL 500 MG TA: 500 | 7 days supply | Qty: 14 | Fill #0

## 2017-08-04 NOTE — Telephone Encounter (Signed)
CMA attempt to call patient to inform on lab results.  No answer and left a VM for patient to call back. If patient call back, please inform:  Your prostate levels continue to rise which could be indicative or prostate infection or cancer. You still have not signed up for the financial assistance. It is very urgent that you follow up with the financial counselor so that you can be referred to Urology. I have sent in an antibiotic for your UTI for you to take for 7 days.

## 2017-08-04 NOTE — Telephone Encounter (Signed)
Patient called and I informed him that his prostate levels continue to rise which could be indicative or prostate infection or cancer. You still have not signed up for the financial assistance. It is very urgent that you follow up with the financial counselor so that you can be referred to Urology. I have sent in an antibiotic for your UTI for you to take for 7 days.

## 2017-08-04 NOTE — Telephone Encounter (Signed)
-----   Message from Claiborne RiggZelda W Fleming, NP sent at 08/04/2017  1:16 PM EDT ----- Your prostate levels continue to rise which could be indicative or prostate infection or cancer. You still have not signed up for the financial assistance. It is very urgent that you follow up with the financial counselor so that you can be referred to Urology. I have sent in an antibiotic for your UTI for you to take for 7 days.

## 2017-08-17 ENCOUNTER — Ambulatory Visit: Payer: Self-pay | Attending: Nurse Practitioner

## 2017-08-28 ENCOUNTER — Other Ambulatory Visit: Payer: Self-pay

## 2017-08-28 DIAGNOSIS — R350 Frequency of micturition: Principal | ICD-10-CM

## 2017-08-28 DIAGNOSIS — N401 Enlarged prostate with lower urinary tract symptoms: Secondary | ICD-10-CM

## 2017-08-31 ENCOUNTER — Other Ambulatory Visit: Payer: Self-pay | Admitting: Nurse Practitioner

## 2017-08-31 DIAGNOSIS — J301 Allergic rhinitis due to pollen: Secondary | ICD-10-CM

## 2017-08-31 MED ORDER — TAMSULOSIN HCL 0.4 MG PO CAPS: 0.8000 mg | ORAL_CAPSULE | Freq: Every day | ORAL | 1 refills | Status: AC

## 2017-08-31 MED FILL — $VENTOLIN HFA 18G INHALER: 108 (90 BAS | 75 days supply | Qty: 54 | Fill #0

## 2017-08-31 MED FILL — TAMSULOSIN HCL 0.4 MG CAP: 0.4 | 30 days supply | Qty: 60 | Fill #0

## 2017-09-07 ENCOUNTER — Ambulatory Visit: Payer: Worker's Compensation | Attending: Nurse Practitioner

## 2017-09-25 ENCOUNTER — Telehealth: Payer: Self-pay

## 2017-09-25 NOTE — Telephone Encounter (Signed)
Noted  [facility] .[facility] will contact the patient to schedule an appointment with the specialist through the [facility] card.  With the [employer] he will go to [facility] [place]

## 2017-09-25 NOTE — Telephone Encounter (Signed)
Patient called and stated that he has received the financial assistance and was wanting to proceed with his urology referral. Patient stated for a call once referral is open again and the process is started. please fu at your earliest convenience.

## 2017-09-25 NOTE — Telephone Encounter (Signed)
Will route to PCP 

## 2017-10-07 MED FILL — traZODone HCL 100 MG TABS: 100 | 30 days supply | Qty: 30 | Fill #1

## 2017-10-07 MED FILL — TAMSULOSIN HCL 0.4 MG CAP: 0.4 | 30 days supply | Qty: 60 | Fill #1

## 2017-10-20 ENCOUNTER — Encounter: Payer: Self-pay | Admitting: Nurse Practitioner

## 2017-10-20 NOTE — Progress Notes (Signed)
Per review of Urology notes 10-16-2017  DX: BPH with LUTS, LOW PVR Continue 0.8mg  of Flomax Return for cystoscopy to assess for outlet obstruction or stricture Repeated PSA; awaiting results to determine if needs prostate biopsy Last PSA 6.5  Next APPT: 10-22-2017

## 2017-11-04 MED FILL — SULFAMETHOXAZOLE-TMP DS TAB: 800-160 | 7 days supply | Qty: 14 | Fill #0

## 2017-11-06 MED FILL — SULFAMETHOXAZOLE-TMP SS TAB: 400-80 | 20 days supply | Qty: 20 | Fill #0

## 2017-11-23 MED FILL — TAMSULOSIN HCL 0.4 MG CAP: 0.4 | 30 days supply | Qty: 60 | Fill #0

## 2017-12-08 ENCOUNTER — Ambulatory Visit: Payer: Self-pay | Attending: Nurse Practitioner | Admitting: Nurse Practitioner

## 2017-12-08 ENCOUNTER — Encounter: Payer: Self-pay | Admitting: Nurse Practitioner

## 2017-12-08 ENCOUNTER — Other Ambulatory Visit: Payer: Self-pay

## 2017-12-08 VITALS — BP 142/90 | HR 71 | Temp 98.0°F | Ht 68.0 in | Wt 248.2 lb

## 2017-12-08 DIAGNOSIS — R001 Bradycardia, unspecified: Secondary | ICD-10-CM | POA: Insufficient documentation

## 2017-12-08 DIAGNOSIS — F419 Anxiety disorder, unspecified: Secondary | ICD-10-CM | POA: Insufficient documentation

## 2017-12-08 DIAGNOSIS — Z0000 Encounter for general adult medical examination without abnormal findings: Secondary | ICD-10-CM | POA: Insufficient documentation

## 2017-12-08 DIAGNOSIS — F5104 Psychophysiologic insomnia: Secondary | ICD-10-CM | POA: Insufficient documentation

## 2017-12-08 DIAGNOSIS — Z791 Long term (current) use of non-steroidal anti-inflammatories (NSAID): Secondary | ICD-10-CM | POA: Insufficient documentation

## 2017-12-08 DIAGNOSIS — I10 Essential (primary) hypertension: Secondary | ICD-10-CM | POA: Insufficient documentation

## 2017-12-08 DIAGNOSIS — Z79899 Other long term (current) drug therapy: Secondary | ICD-10-CM | POA: Insufficient documentation

## 2017-12-08 DIAGNOSIS — Z8249 Family history of ischemic heart disease and other diseases of the circulatory system: Secondary | ICD-10-CM | POA: Insufficient documentation

## 2017-12-08 MED ORDER — TRAZODONE HCL 100 MG PO TABS: 100.0000 mg | ORAL_TABLET | Freq: Every day | ORAL | 2 refills | Status: DC

## 2017-12-08 MED FILL — traZODone HCL 100 MG TABS: 100 | 30 days supply | Qty: 30 | Fill #0

## 2017-12-08 NOTE — Progress Notes (Signed)
Assessment & Plan:  [name] was seen today for annual exam.  Diagnoses and all orders for this visit:  Encounter for routine history and physical exam for male -     CBC -     CMP14+EGFR -     Lipid panel  Psychophysiological insomnia -     traZODone (DESYREL) 100 MG tablet; Take 1 tablet (100 mg total) by mouth at bedtime. Chronic and stable on trazodone.   Patient has been counseled on age-appropriate routine health concerns for screening and prevention. These are reviewed and up-to-date. Referrals have been placed accordingly. Immunizations are up-to-date or declined.    Subjective:   Chief Complaint  Patient presents with  . Annual Exam    Pt. is here for a physical .   HPI Cody Diaz 62 y.o. male presents to office today   Review of Systems  Constitutional: Negative for fever, malaise/fatigue and weight loss.  HENT: Negative.  Negative for nosebleeds.   Eyes: Positive for blurred vision. Negative for double vision and photophobia.  Respiratory: Negative.  Negative for cough and shortness of breath.   Cardiovascular: Negative.  Negative for chest pain, palpitations and leg swelling.  Gastrointestinal: Negative.  Negative for heartburn, nausea and vomiting.  Genitourinary: Negative.   Musculoskeletal: Positive for joint pain (left knee pain). Negative for myalgias.  Skin: Negative.   Neurological: Negative.  Negative for dizziness, focal weakness, seizures and headaches.  Endo/Heme/Allergies: Negative.   Psychiatric/Behavioral: Negative for suicidal ideas. The patient is nervous/anxious and has insomnia.     Past Medical History:  Diagnosis Date  . Anxiety   . Hypertension     Past Surgical History:  Procedure Laterality Date  . KNEE SURGERY      Family History  Problem Relation Age of Onset  . Diabetes Mother   . COPD Mother   . Hypertension Mother   . Heart disease Mother   . Kidney failure Mother     Social History Reviewed with no changes to be  made today.   Outpatient Medications Prior to Visit  Medication Sig Dispense Refill  . tamsulosin (FLOMAX) 0.4 MG CAPS capsule Take 2 capsules (0.8 mg total) by mouth daily after supper. 60 capsule 1  . VENTOLIN HFA 108 (90 Base) MCG/ACT inhaler INHALE 1-2 PUFFS INTO THE LUNGS EVERY 6 (SIX) HOURS AS NEEDED FOR WHEEZING OR SHORTNESS OF BREATH. 18 g 2  . cyclobenzaprine (FLEXERIL) 10 MG tablet Take 1 tablet (10 mg total) by mouth 3 (three) times daily as needed for muscle spasms. (Patient not taking: Reported on 07/29/2017) 20 tablet 0  . ibuprofen (ADVIL,MOTRIN) 200 MG tablet Take 600 mg by mouth every 6 (six) hours as needed.    . traZODone (DESYREL) 100 MG tablet Take 1 tablet (100 mg total) by mouth at bedtime. (Patient not taking: Reported on 07/29/2017) 30 tablet 2   No facility-administered medications prior to visit.     No Known Allergies     Objective:    BP (!) 142/90 (BP Location: Left Arm, Patient Position: Sitting, Cuff Size: Large)   Pulse 71   Temp 98 F (36.7 C) (Oral)   Ht _0  (1.727 m)   Wt 248 lb 3.2 oz (112.6 kg)   SpO2 94%   BMI 37.74 kg/m  Wt Readings from Last 3 Encounters:  12/08/17 248 lb 3.2 oz (112.6 kg)  07/31/17 244 lb (110.7 kg)  07/29/17 240 lb (108.9 kg)    Physical Exam  Constitutional: He is  oriented to person, place, and time. He appears well-developed and well-nourished.  HENT:  Head: Normocephalic and atraumatic.  Right Ear: Hearing, tympanic membrane, external ear and ear canal normal.  Left Ear: Hearing, tympanic membrane, external ear and ear canal normal.  Nose: Mucosal edema present. No rhinorrhea.  Mouth/Throat: Uvula is midline, oropharynx is clear and moist and mucous membranes are normal. No oral lesions. No trismus in the jaw. Abnormal dentition. Dental caries present. No dental abscesses or uvula swelling. Tonsils are 1+ on the right. Tonsils are 1+ on the left. No tonsillar exudate.  Eyes: Pupils are equal, round, and reactive to  light. Conjunctivae, EOM and lids are normal. No scleral icterus.  Fundoscopic exam:      The left eye shows no red reflex.  Neck: Normal range of motion and full passive range of motion without pain. Neck supple. No tracheal deviation present. No thyroid mass and no thyromegaly present.  Cardiovascular: Normal rate, regular rhythm, normal heart sounds and intact distal pulses. Exam reveals no gallop and no friction rub.  No murmur heard. Pulses:      Dorsalis pedis pulses are 2+ on the right side, and 2+ on the left side.       Posterior tibial pulses are 2+ on the right side, and 2+ on the left side.  Pulmonary/Chest: Effort normal and breath sounds normal. No respiratory distress. He has no wheezes. He has no rales. He exhibits no mass and no tenderness. Right breast exhibits no inverted nipple, no mass, no nipple discharge, no skin change and no tenderness. Left breast exhibits no inverted nipple, no mass, no nipple discharge, no skin change and no tenderness.  Abdominal: Soft. Bowel sounds are normal. He exhibits distension. He exhibits no mass. There is no tenderness. There is no rebound and no guarding. No hernia. Hernia confirmed negative in the right inguinal area and confirmed negative in the left inguinal area.    Genitourinary: Testes normal and penis normal. Right testis shows no mass, no swelling and no tenderness. Right testis is descended. Cremasteric reflex is not absent on the right side. Left testis shows no mass, no swelling and no tenderness. Left testis is descended. Cremasteric reflex is not absent on the left side. No penile tenderness.  Musculoskeletal: He exhibits no edema, tenderness or deformity.       Left knee: He exhibits decreased range of motion and swelling.  Feet:  Right Foot:  Skin Integrity: Positive for callus.  Left Foot:  Skin Integrity: Positive for callus.  Lymphadenopathy:    He has no cervical adenopathy. No inguinal adenopathy noted on the right or  left side.       Right: No inguinal adenopathy present.       Left: No inguinal adenopathy present.  Neurological: He is alert and oriented to person, place, and time. He displays normal reflexes. No cranial nerve deficit. He exhibits normal muscle tone. Coordination normal.  Skin: Skin is warm and dry. Capillary refill takes less than 2 seconds. No erythema.  Psychiatric: He has a normal mood and affect. His behavior is normal. Judgment and thought content normal.      Patient has been counseled extensively about nutrition and exercise as well as the importance of adherence with medications and regular follow-up. The patient was given clear instructions to go to ER or return to medical center if symptoms don't improve, worsen or new problems develop. The patient verbalized understanding.   Follow-up: Return in about 3 months (around  03/10/2018).   Gildardo Pounds, FNP-BC [facility] [place], [place]   12/09/2017, 10:04 PM

## 2017-12-09 ENCOUNTER — Encounter: Payer: Self-pay | Admitting: Nurse Practitioner

## 2017-12-09 LAB — CBC
HEMATOCRIT: 43.7 % (ref 37.5–51.0)
HEMOGLOBIN: 15.2 g/dL (ref 13.0–17.7)
MCH: 32.1 pg (ref 26.6–33.0)
MCHC: 34.8 g/dL (ref 31.5–35.7)
MCV: 92 fL (ref 79–97)
Platelets: 231 10*3/uL (ref 150–450)
RBC: 4.73 x10E6/uL (ref 4.14–5.80)
RDW: 14 % (ref 12.3–15.4)
WBC: 8.2 10*3/uL (ref 3.4–10.8)

## 2017-12-09 LAB — CMP14+EGFR
ALBUMIN: 4.6 g/dL (ref 3.6–4.8)
ALK PHOS: 73 IU/L (ref 39–117)
ALT: 51 IU/L — ABNORMAL HIGH (ref 0–44)
AST: 35 IU/L (ref 0–40)
Albumin/Globulin Ratio: 1.8 (ref 1.2–2.2)
BUN / CREAT RATIO: 12 (ref 10–24)
BUN: 11 mg/dL (ref 8–27)
Bilirubin Total: 0.4 mg/dL (ref 0.0–1.2)
CO2: 26 mmol/L (ref 20–29)
CREATININE: 0.89 mg/dL (ref 0.76–1.27)
Calcium: 9.3 mg/dL (ref 8.6–10.2)
Chloride: 98 mmol/L (ref 96–106)
GFR calc Af Amer: 106 mL/min/{1.73_m2} (ref >59)
GFR, EST NON AFRICAN AMERICAN: 92 mL/min/{1.73_m2} (ref >59)
GLOBULIN, TOTAL: 2.5 g/dL (ref 1.5–4.5)
GLUCOSE: 97 mg/dL (ref 65–99)
Potassium: 4.4 mmol/L (ref 3.5–5.2)
SODIUM: 141 mmol/L (ref 134–144)
Total Protein: 7.1 g/dL (ref 6.0–8.5)

## 2017-12-09 LAB — LIPID PANEL
CHOLESTEROL TOTAL: 173 mg/dL (ref 100–199)
Chol/HDL Ratio: 3.3 ratio (ref 0.0–5.0)
HDL: 52 mg/dL (ref >39)
LDL Calculated: 87 mg/dL (ref 0–99)
TRIGLYCERIDES: 168 mg/dL — AB (ref 0–149)
VLDL CHOLESTEROL CAL: 34 mg/dL (ref 5–40)

## 2017-12-10 ENCOUNTER — Telehealth: Payer: Self-pay

## 2017-12-10 ENCOUNTER — Telehealth: Payer: Self-pay | Admitting: Nurse Practitioner

## 2017-12-10 NOTE — Telephone Encounter (Signed)
-----   Message from Claiborne Rigg, NP sent at 12/09/2017 10:07 PM EDT ----- All labs are normal except triglycerides slightly elevated. Try to abstain or reduce your intake of red meat and fatty foods

## 2017-12-10 NOTE — Telephone Encounter (Signed)
Patient called to return you call regarding his results please follow up.

## 2017-12-10 NOTE — Telephone Encounter (Signed)
CMA attempt to reach patient to inform on results.  No answer and left a VM for patient for a call back.  If patient call back, please inform:  All labs are normal except triglycerides slightly elevated. Try to abstain or reduce your intake of red meat and fatty foods

## 2017-12-11 NOTE — Telephone Encounter (Signed)
Patient verified DOB and authorized for note to be read that CMA left for patient. Patient was notified if he had any questions or concerns he would need to follow up with CMA patient understood.

## 2017-12-14 MED FILL — CIPROFLOXACIN HCL 500 MG TA: 500 | 30 days supply | Qty: 60 | Fill #0

## 2017-12-16 ENCOUNTER — Encounter (HOSPITAL_COMMUNITY): Payer: Self-pay | Admitting: Physician Assistant

## 2017-12-16 DIAGNOSIS — N390 Urinary tract infection, site not specified: Secondary | ICD-10-CM | POA: Diagnosis present

## 2017-12-16 DIAGNOSIS — M1732 Unilateral post-traumatic osteoarthritis, left knee: Secondary | ICD-10-CM

## 2017-12-16 HISTORY — DX: Unilateral post-traumatic osteoarthritis, left knee: M17.32

## 2017-12-16 NOTE — Pre-Procedure Instructions (Signed)
Rolen Conger Terpening  12/16/2017      [facility] [place], [place] [address] [place] [address] Phone: [phone] Fax: [phone]    Your procedure is scheduled on November 4th.  Report to [facility] Admitting at 0530 A.M.  Call this number if you have problems the morning of surgery:  [phone]   Remember:  Do not eat or drink after midnight.    Take these medicines the morning of surgery with A SIP OF WATER   cyclobenzaprine (FLEXERIL) (if needed)  VENTOLIN HFA ( if needed) Bring Inhaler with you  7 days prior to surgery STOP taking any Aspirin(unless otherwise instructed by your surgeon), Aleve, Naproxen, Ibuprofen, Motrin, Advil, Goody's, BC's, all herbal medications, fish oil, and all vitamins     Do not wear jewelry.  Do not wear lotions, powders, or colognes, or deodorant.  Men may shave face and neck.  Do not bring valuables to the hospital.  [facility] is not responsible for any belongings or valuables.  Contacts, dentures or bridgework may not be worn into surgery.  Leave your suitcase in the car.  After surgery it may be brought to your room.  For patients admitted to the hospital, discharge time will be determined by your treatment team.  Patients discharged the day of surgery will not be allowed to drive home.    [place]- Preparing For Surgery  Before surgery, you can play an important role. Because skin is not sterile, your skin needs to be as free of germs as possible. You can reduce the number of germs on your skin by washing with CHG (chlorahexidine gluconate) Soap before surgery.  CHG is an antiseptic cleaner which kills germs and bonds with the skin to continue killing germs even after washing.    Oral Hygiene is also important to reduce your risk of infection.  Remember - BRUSH YOUR TEETH THE MORNING OF SURGERY WITH YOUR REGULAR TOOTHPASTE  Please do not use if you have an  allergy to CHG or antibacterial soaps. If your skin becomes reddened/irritated stop using the CHG.  Do not shave (including legs and underarms) for at least 48 hours prior to first CHG shower. It is OK to shave your face.  Please follow these instructions carefully.   1. Shower the NIGHT BEFORE SURGERY and the MORNING OF SURGERY with CHG.   2. If you chose to wash your hair, wash your hair first as usual with your normal shampoo.  3. After you shampoo, rinse your hair and body thoroughly to remove the shampoo.  4. Use CHG as you would any other liquid soap. You can apply CHG directly to the skin and wash gently with a scrungie or a clean washcloth.   5. Apply the CHG Soap to your body ONLY FROM THE NECK DOWN.  Do not use on open wounds or open sores. Avoid contact with your eyes, ears, mouth and genitals (private parts). Wash Face and genitals (private parts)  with your normal soap.  6. Wash thoroughly, paying special attention to the area where your surgery will be performed.  7. Thoroughly rinse your body with warm water from the neck down.  8. DO NOT shower/wash with your normal soap after using and rinsing off the CHG Soap.  9. Pat yourself dry with a CLEAN TOWEL.  10. Wear CLEAN PAJAMAS to bed the night before surgery, wear comfortable clothes the morning of surgery  11. Place CLEAN SHEETS on your bed the night of your first shower and DO NOT SLEEP WITH PETS.    Day of Surgery:  Do not apply any deodorants/lotions.  Please wear clean clothes to the hospital/surgery center.   Remember to brush your teeth WITH YOUR REGULAR TOOTHPASTE.    Please read over the following fact sheets that you were given.

## 2017-12-16 NOTE — Pre-Procedure Instructions (Signed)
Cody Diaz  12/16/2017      [facility] [place], [place] - [place] [address] [place] [address] Phone: [phone] Fax: [phone]    Your procedure is scheduled on Friday,  November 4th.   Report to [facility] Admitting at 0530 A.M.             (posted surgery time  7:30a - 9:26a)   Call this number if you have problems the morning of surgery:  [phone]   Remember:   Do not eat any foods or drink any liquids after midnight, Thursday.    Take these medicines the morning of surgery with A SIP OF WATER   cyclobenzaprine (FLEXERIL) (if needed)  VENTOLIN HFA ( if needed) Bring Inhaler with you  7 days prior to surgery STOP taking any Aspirin(unless otherwise instructed by your surgeon), Aleve, Naproxen, Ibuprofen, Motrin, Advil, Goody's, BC's, all herbal medications, fish oil, and all vitamins   Do not wear jewelry.  Do not wear lotions, powders, or colognes, or deodorant.  Men may shave face and neck.  Do not bring valuables to the hospital.  [facility] is not responsible for any belongings or valuables.  Contacts, dentures or bridgework may not be worn into surgery.  Leave your suitcase in the car.  After surgery it may be brought to your room.  For patients admitted to the hospital, discharge time will be determined by your treatment team.      [facility]- Preparing For Surgery  Before surgery, you can play an important role. Because skin is not sterile, your skin needs to be as free of germs as possible. You can reduce the number of germs on your skin by washing with CHG (chlorahexidine gluconate) Soap before surgery.  CHG is an antiseptic cleaner which kills germs and bonds with the skin to continue killing germs even after washing.    Oral Hygiene is also important to reduce your risk of infection.    Remember - BRUSH YOUR TEETH THE MORNING OF SURGERY WITH YOUR REGULAR TOOTHPASTE  Please do  not use if you have an allergy to CHG or antibacterial soaps. If your skin becomes reddened/irritated stop using the CHG.  Do not shave (including legs and underarms) for at least 48 hours prior to first CHG shower. It is OK to shave your face.  Please follow these instructions carefully.   1. Shower the NIGHT BEFORE SURGERY and the MORNING OF SURGERY with CHG.   2. If you chose to wash your hair, wash your hair first as usual with your normal shampoo.  3. After you shampoo, rinse your hair and body thoroughly to remove the shampoo.  4. Use CHG as you would any other liquid soap. You can apply CHG directly to the skin and wash gently with a scrungie or a clean washcloth.   5. Apply the CHG Soap to your body ONLY FROM THE NECK DOWN.  Do not use on open wounds or open sores. Avoid contact with your eyes, ears, mouth and genitals (private parts). Wash Face and genitals (private parts)  with your normal soap.  6. Wash thoroughly, paying special attention to the area where your surgery will be performed.  7. Thoroughly rinse your body with warm water from the neck down.  8. DO NOT shower/wash with your normal soap after using and rinsing off the CHG Soap.  9. Pat yourself dry with a CLEAN TOWEL.  10. Wear CLEAN PAJAMAS to bed the night before surgery, wear comfortable clothes the morning of surgery  11. Place CLEAN SHEETS on your bed the night of your first shower and DO NOT SLEEP WITH PETS.  Day of Surgery:  Do not apply any deodorants/lotions.  Please wear clean clothes to the hospital/surgery center.    Remember to brush your teeth WITH YOUR REGULAR TOOTHPASTE.  Please read over the following fact sheets that you were given.

## 2017-12-16 NOTE — H&P (Signed)
TOTAL KNEE ADMISSION H&P  Patient is being admitted for left total knee arthroplasty.  Subjective:  Chief Complaint:left knee pain.  HPI: Cody Diaz, 62 y.o. male, has a history of pain and functional disability in the left knee due to trauma and has failed non-surgical conservative treatments for greater than 12 weeks to includeNSAID's and/or analgesics, corticosteriod injections, viscosupplementation injections, flexibility and strengthening excercises, supervised PT with diminished ADL's post treatment, use of assistive devices, weight reduction as appropriate and activity modification.  Onset of symptoms was abrupt, starting 2 years ago with rapidlly worsening course since that time. The patient noted prior procedures on the knee to include  arthroscopy and menisectomy on the left knee(s).  Patient currently rates pain in the left knee(s) at 10 out of 10 with activity. Patient has night pain, worsening of pain with activity and weight bearing, pain that interferes with activities of daily living, crepitus and joint swelling.  Patient has evidence of subchondral sclerosis, periarticular osteophytes and joint space narrowing by imaging studies.  There is no active infection.  Patient Active Problem List   Diagnosis Date Noted  . Post-traumatic osteoarthritis of left knee 12/16/2017  . UTI (urinary tract infection)   . Urinary frequency 08/27/2016  . Benign prostatic hyperplasia with urinary frequency 01/12/2016  . UTI due to Klebsiella species 12/10/2015  . Bilateral knee pain 07/27/2015  . Vitamin D deficiency 05/02/2015  . Hypertension 05/01/2015  . Prediabetes 05/01/2015  . Generalized anxiety disorder 05/01/2015  . Caregiver stress 05/01/2015  . 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 Low back pain 08/10/2014   Past Medical History:  Diagnosis Date  . Anxiety   . Hypertension   . Post-traumatic osteoarthritis of left knee 12/16/2017  . UTI (urinary tract infection)     currently on Cipro 500mg  BID    Past Surgical History:  Procedure Laterality Date  . KNEE SURGERY      No current facility-administered medications for this encounter.    Current Outpatient Medications  Medication Sig Dispense Refill Last Dose  . ciprofloxacin (CIPRO) 500 MG tablet Take 500 mg by mouth 2 (two) times daily.   12/16/2017 at Unknown time  . tamsulosin (FLOMAX) 0.4 MG CAPS capsule Take 2 capsules (0.8 mg total) by mouth daily after supper. (Patient taking differently: Take 0.4 mg by mouth every evening. ) 60 capsule 1 12/16/2017 at Unknown time  . VENTOLIN HFA 108 (90 Base) MCG/ACT inhaler INHALE 1-2 PUFFS INTO THE LUNGS EVERY 6 (SIX) HOURS AS NEEDED FOR WHEEZING OR SHORTNESS OF BREATH. (Patient taking differently: Inhale 2 puffs into the lungs every 6 (six) hours as needed for wheezing or shortness of breath. ) 18 g 2 Taking  . cyclobenzaprine (FLEXERIL) 5 MG tablet Take 5 mg by mouth daily as needed for spasms.  1    No Known Allergies  Social History   Tobacco Use  . Smoking status: Never Smoker  . Smokeless tobacco: Never Used  Substance Use Topics  . Alcohol use: Yes    Alcohol/week: 2.0 standard drinks    Types: 2 Cans of beer per week    Comment: 2 beers per night     Family History  Problem Relation Age of Onset  . Diabetes Mother   . COPD Mother   . Hypertension Mother   . Heart disease Mother   . Kidney failure Mother      Review of Systems  Constitutional: Negative.   HENT: Negative.   Eyes: Negative.   Respiratory: Negative.  Cardiovascular: Negative.   Gastrointestinal: Negative.   Genitourinary: Negative.   Musculoskeletal: Positive for back pain and joint pain.  Skin: Negative.   Neurological: Negative.   Endo/Heme/Allergies: Negative.   Psychiatric/Behavioral: Negative.     Objective:  Physical Exam  Constitutional: He is oriented to person, place, and time. He appears well-developed and well-nourished.  HENT:  Head: Normocephalic  and atraumatic.  Mouth/Throat: Oropharynx is clear and moist.  Eyes: Pupils are equal, round, and reactive to light. Conjunctivae are normal.  Neck: Neck supple.  Cardiovascular: Normal rate.  Respiratory: Effort normal.  GI: Soft.  Genitourinary:  Genitourinary Comments: Not pertinent to current symptomatology therefore not examined.  Musculoskeletal:  Examination of his left knee reveals pain medial and  lateral.  There is 1+ effusion. Range of motion is 0-120 degrees.  The knee is stable with normal patellar tracking.  Neurological: He is alert and oriented to person, place, and time.  Skin: Skin is warm and dry.  Psychiatric: He has a normal mood and affect. His behavior is normal.    Vital signs in last 24 hours: Temp:  [97.5 F (36.4 C)] 97.5 F (36.4 C) (10/23 1600) Pulse Rate:  [89] 89 (10/23 1600) BP: (157)/(98) 157/98 (10/23 1600) SpO2:  [95 %] 95 % (10/23 1600) Weight:  [110.9 kg] 110.9 kg (10/23 1600)  Labs:   Estimated body mass index is 37.19 kg/m as calculated from the following:   Height as of this encounter: 5\' 8"  (1.727 m).   Weight as of this encounter: 110.9 kg.   Imaging Review Plain radiographs demonstrate severe degenerative joint disease of the left knee(s). The overall alignment issignificant varus. The bone quality appears to be good for age and reported activity level.   Preoperative templating of the joint replacement has been completed, documented, and submitted to the Operating Room personnel in order to optimize intra-operative equipment management.    Patient's anticipated LOS is less than 2 midnights, meeting these requirements: - Younger than 43 - Lives within 1 hour of care - Has a competent adult at home to recover with post-op recover - NO history of  - Chronic pain requiring opiods  - Diabetes  - Coronary Artery Disease  - Heart failure  - Heart attack  - Stroke  - DVT/VTE  - Cardiac arrhythmia  - Respiratory  Failure/COPD  - Renal failure  - Anemia  - Advanced Liver disease        Assessment/Plan:  End stage arthritis, left knee  Principal Problem:   Post-traumatic osteoarthritis of left knee Active Problems:   Low back pain   Hypertension   Generalized anxiety disorder   Bilateral knee pain   Benign prostatic hyperplasia with urinary frequency   UTI (urinary tract infection)   The patient history, physical examination, clinical judgment of the provider and imaging studies are consistent with end stage degenerative joint disease of the left knee(s) and total knee arthroplasty is deemed medically necessary. The treatment options including medical management, injection therapy arthroscopy and arthroplasty were discussed at length. The risks and benefits of total knee arthroplasty were presented and reviewed. The risks due to aseptic loosening, infection, stiffness, patella tracking problems, thromboembolic complications and other imponderables were discussed. The patient acknowledged the explanation, agreed to proceed with the plan and consent was signed. Patient is being admitted for inpatient treatment for surgery, pain control, PT, OT, prophylactic antibiotics, VTE prophylaxis, progressive ambulation and ADL's and discharge planning. The patient is planning to be discharged home with  home health services.  This is a worker's comp injury.  Injury Date October 25th, 2018.  Patient has recurrent Klebsiella UTI  Currently on Cipro

## 2017-12-17 ENCOUNTER — Encounter (HOSPITAL_COMMUNITY)
Admission: RE | Admit: 2017-12-17 | Discharge: 2017-12-17 | Disposition: A | Discharge status: Home / Self Care | Payer: Self-pay | Source: Ambulatory Visit | Attending: Orthopedic Surgery | Admitting: Orthopedic Surgery

## 2017-12-17 ENCOUNTER — Other Ambulatory Visit: Payer: Self-pay

## 2017-12-17 ENCOUNTER — Encounter (HOSPITAL_COMMUNITY): Payer: Self-pay

## 2017-12-17 DIAGNOSIS — Z01812 Encounter for preprocedural laboratory examination: Secondary | ICD-10-CM | POA: Insufficient documentation

## 2017-12-17 HISTORY — DX: Nausea with vomiting, unspecified: R11.2

## 2017-12-17 HISTORY — DX: Other specified postprocedural states: Z98.890

## 2017-12-17 HISTORY — DX: Gastro-esophageal reflux disease without esophagitis: K21.9

## 2017-12-17 LAB — CBC WITH DIFFERENTIAL/PLATELET
ABS IMMATURE GRANULOCYTES: 0.06 10*3/uL (ref 0.00–0.07)
BASOS ABS: 0.1 10*3/uL (ref 0.0–0.1)
BASOS PCT: 1 %
Eosinophils Absolute: 0.5 10*3/uL (ref 0.0–0.5)
Eosinophils Relative: 5 %
HCT: 50.5 % (ref 39.0–52.0)
Hemoglobin: 16.2 g/dL (ref 13.0–17.0)
Immature Granulocytes: 1 %
LYMPHS PCT: 33 %
Lymphs Abs: 3 10*3/uL (ref 0.7–4.0)
MCH: 30.4 pg (ref 26.0–34.0)
MCHC: 32.1 g/dL (ref 30.0–36.0)
MCV: 94.7 fL (ref 80.0–100.0)
MONO ABS: 0.9 10*3/uL (ref 0.1–1.0)
Monocytes Relative: 9 %
NEUTROS PCT: 51 %
NRBC: 0 % (ref 0.0–0.2)
Neutro Abs: 4.8 10*3/uL (ref 1.7–7.7)
PLATELETS: 218 10*3/uL (ref 150–400)
RBC: 5.33 MIL/uL (ref 4.22–5.81)
RDW: 13.6 % (ref 11.5–15.5)
WBC: 9.2 10*3/uL (ref 4.0–10.5)

## 2017-12-17 LAB — TYPE AND SCREEN
ABO/RH(D): AB POS
Antibody Screen: NEGATIVE

## 2017-12-17 LAB — COMPREHENSIVE METABOLIC PANEL
ALBUMIN: 4.3 g/dL (ref 3.5–5.0)
ALT: 54 U/L — ABNORMAL HIGH (ref 0–44)
ANION GAP: 7 (ref 5–15)
AST: 40 U/L (ref 15–41)
Alkaline Phosphatase: 63 U/L (ref 38–126)
BUN: 12 mg/dL (ref 8–23)
CHLORIDE: 104 mmol/L (ref 98–111)
CO2: 27 mmol/L (ref 22–32)
Calcium: 9.3 mg/dL (ref 8.9–10.3)
Creatinine, Ser: 0.91 mg/dL (ref 0.61–1.24)
GFR calc non Af Amer: >60 mL/min (ref >60)
GLUCOSE: 118 mg/dL — AB (ref 70–99)
Potassium: 4.1 mmol/L (ref 3.5–5.1)
SODIUM: 138 mmol/L (ref 135–145)
Total Bilirubin: 0.8 mg/dL (ref 0.3–1.2)
Total Protein: 7.7 g/dL (ref 6.5–8.1)

## 2017-12-17 LAB — SURGICAL PCR SCREEN
MRSA, PCR: NEGATIVE
Staphylococcus aureus: NEGATIVE

## 2017-12-17 LAB — PROTIME-INR
INR: 1.09
Prothrombin Time: 14 seconds (ref 11.4–15.2)

## 2017-12-17 LAB — ABO/RH: ABO/RH(D): AB POS

## 2017-12-17 NOTE — Progress Notes (Signed)
PCP is Bertram Denver, NP from [facility] LOV 11/2017 Denies murmur, cp, sob.   Denies any cardiac testing EKG 11/2017

## 2017-12-18 LAB — URINE CULTURE: CULTURE: NO GROWTH

## 2017-12-18 MED FILL — TAMSULOSIN HCL 0.4 MG CAP: 0.4 | 30 days supply | Qty: 60 | Fill #0

## 2017-12-27 MED ORDER — TRANEXAMIC ACID-NACL 1000-0.7 MG/100ML-% IV SOLN
1000.0000 mg | INTRAVENOUS | Status: AC
  Administered 2017-12-28: 1000 mg via INTRAVENOUS
  Filled 2017-12-27: qty 100

## 2017-12-27 NOTE — Anesthesia Preprocedure Evaluation (Addendum)
Anesthesia Evaluation  Patient identified by MRN, date of birth, ID band Patient awake    Reviewed: Allergy & Precautions, NPO status , Patient's Chart, lab work & pertinent test results  History of Anesthesia Complications (+) PONV and history of anesthetic complications  Airway Mallampati: II  TM Distance: >3 FB Neck ROM: Full    Dental  (+) Teeth Intact   Pulmonary asthma ,    breath sounds clear to auscultation       Cardiovascular hypertension,  Rhythm:Regular Rate:Normal     Neuro/Psych negative neurological ROS     GI/Hepatic negative GI ROS, Neg liver ROS,   Endo/Other  Obese, BMI 37.4  Renal/GU negative Renal ROS     Musculoskeletal  (+) Arthritis , Osteoarthritis,    Abdominal   Peds  Hematology negative hematology ROS (+)   Anesthesia Other Findings Day of surgery medications reviewed with the patient.  Reproductive/Obstetrics                            Anesthesia Physical Anesthesia Plan  ASA: II  Anesthesia Plan: Spinal   Post-op Pain Management:  Regional for Post-op pain   Induction:   PONV Risk Score and Plan: 2 and Treatment may vary due to age or medical condition, Ondansetron, Propofol infusion and Midazolam  Airway Management Planned: Natural Airway and Nasal Cannula  Additional Equipment:   Intra-op Plan:   Post-operative Plan:   Informed Consent: I have reviewed the patients History and Physical, chart, labs and discussed the procedure including the risks, benefits and alternatives for the proposed anesthesia with the patient or authorized representative who has indicated his/her understanding and acceptance.   Dental advisory given  Plan Discussed with: CRNA  Anesthesia Plan Comments: (Spinal + adductor canal block)      Anesthesia Quick Evaluation

## 2017-12-28 ENCOUNTER — Encounter (HOSPITAL_COMMUNITY): Payer: Self-pay

## 2017-12-28 ENCOUNTER — Encounter (HOSPITAL_COMMUNITY)
Admission: RE | Disposition: A | Discharge status: Home Health | Payer: Self-pay | Source: Ambulatory Visit | Attending: Orthopedic Surgery

## 2017-12-28 ENCOUNTER — Other Ambulatory Visit: Payer: Self-pay

## 2017-12-28 ENCOUNTER — Inpatient Hospital Stay (HOSPITAL_COMMUNITY): Payer: Worker's Compensation | Admitting: Anesthesiology

## 2017-12-28 ENCOUNTER — Inpatient Hospital Stay (HOSPITAL_COMMUNITY)
Admission: RE | Admit: 2017-12-28 | Discharge: 2017-12-30 | DRG: 470 | Disposition: A | Discharge status: Home Health | Payer: Worker's Compensation | Source: Ambulatory Visit | Attending: Orthopedic Surgery | Admitting: Orthopedic Surgery

## 2017-12-28 DIAGNOSIS — E669 Obesity, unspecified: Secondary | ICD-10-CM | POA: Diagnosis present

## 2017-12-28 DIAGNOSIS — F411 Generalized anxiety disorder: Secondary | ICD-10-CM | POA: Diagnosis present

## 2017-12-28 DIAGNOSIS — K219 Gastro-esophageal reflux disease without esophagitis: Secondary | ICD-10-CM | POA: Diagnosis present

## 2017-12-28 DIAGNOSIS — B961 Klebsiella pneumoniae [K. pneumoniae] as the cause of diseases classified elsewhere: Secondary | ICD-10-CM | POA: Diagnosis present

## 2017-12-28 DIAGNOSIS — N390 Urinary tract infection, site not specified: Secondary | ICD-10-CM | POA: Diagnosis present

## 2017-12-28 DIAGNOSIS — Z87440 Personal history of urinary (tract) infections: Secondary | ICD-10-CM | POA: Diagnosis not present

## 2017-12-28 DIAGNOSIS — M25561 Pain in right knee: Secondary | ICD-10-CM | POA: Diagnosis present

## 2017-12-28 DIAGNOSIS — Z79899 Other long term (current) drug therapy: Secondary | ICD-10-CM

## 2017-12-28 DIAGNOSIS — N401 Enlarged prostate with lower urinary tract symptoms: Secondary | ICD-10-CM | POA: Diagnosis present

## 2017-12-28 DIAGNOSIS — Z7951 Long term (current) use of inhaled steroids: Secondary | ICD-10-CM | POA: Diagnosis not present

## 2017-12-28 DIAGNOSIS — M1732 Unilateral post-traumatic osteoarthritis, left knee: Principal | ICD-10-CM | POA: Diagnosis present

## 2017-12-28 DIAGNOSIS — I10 Essential (primary) hypertension: Secondary | ICD-10-CM | POA: Diagnosis present

## 2017-12-28 DIAGNOSIS — N400 Enlarged prostate without lower urinary tract symptoms: Secondary | ICD-10-CM | POA: Diagnosis present

## 2017-12-28 DIAGNOSIS — R7303 Prediabetes: Secondary | ICD-10-CM | POA: Diagnosis present

## 2017-12-28 DIAGNOSIS — M25562 Pain in left knee: Secondary | ICD-10-CM

## 2017-12-28 DIAGNOSIS — M545 Low back pain, unspecified: Secondary | ICD-10-CM | POA: Diagnosis present

## 2017-12-28 DIAGNOSIS — Z6837 Body mass index (BMI) 37.0-37.9, adult: Secondary | ICD-10-CM

## 2017-12-28 DIAGNOSIS — R350 Frequency of micturition: Secondary | ICD-10-CM | POA: Diagnosis present

## 2017-12-28 HISTORY — DX: Unilateral post-traumatic osteoarthritis, left knee: M17.32

## 2017-12-28 HISTORY — DX: Urinary tract infection, site not specified: N39.0

## 2017-12-28 HISTORY — PX: TOTAL KNEE ARTHROPLASTY: SHX125

## 2017-12-28 SURGERY — ARTHROPLASTY, KNEE, TOTAL
Anesthesia: Spinal | Site: Knee | Laterality: Left

## 2017-12-28 MED ORDER — PROPOFOL 10 MG/ML IV BOLUS
INTRAVENOUS | Status: AC
  Filled 2017-12-28: qty 20

## 2017-12-28 MED ORDER — HYDROMORPHONE HCL 1 MG/ML IJ SOLN
0.5000 mg | INTRAMUSCULAR | Status: DC | PRN
  Administered 2017-12-28 (×2): 0.5 mg via INTRAVENOUS
  Administered 2017-12-28 – 2017-12-30 (×5): 1 mg via INTRAVENOUS
  Filled 2017-12-28 (×6): qty 1

## 2017-12-28 MED ORDER — 0.9 % SODIUM CHLORIDE (POUR BTL) OPTIME
TOPICAL | Status: DC | PRN
  Administered 2017-12-28: 1000 mL

## 2017-12-28 MED ORDER — BUPIVACAINE-EPINEPHRINE (PF) 0.5% -1:200000 IJ SOLN
INTRAMUSCULAR | Status: DC | PRN
  Administered 2017-12-28: 20 mL via PERINEURAL

## 2017-12-28 MED ORDER — ASPIRIN EC 325 MG PO TBEC
325.0000 mg | DELAYED_RELEASE_TABLET | Freq: Every day | ORAL | Status: DC
  Administered 2017-12-29 – 2017-12-30 (×2): 325 mg via ORAL
  Filled 2017-12-28 (×2): qty 1

## 2017-12-28 MED ORDER — FENTANYL CITRATE (PF) 100 MCG/2ML IJ SOLN
INTRAMUSCULAR | Status: AC
  Filled 2017-12-28: qty 2

## 2017-12-28 MED ORDER — FENTANYL CITRATE (PF) 250 MCG/5ML IJ SOLN
INTRAMUSCULAR | Status: AC
  Filled 2017-12-28: qty 5

## 2017-12-28 MED ORDER — ACETAMINOPHEN 10 MG/ML IV SOLN: 1000.0000 mg | Freq: Once | INTRAVENOUS | Status: DC | PRN

## 2017-12-28 MED ORDER — TAMSULOSIN HCL 0.4 MG PO CAPS
0.4000 mg | ORAL_CAPSULE | Freq: Every evening | ORAL | Status: DC
  Administered 2017-12-28 – 2017-12-29 (×2): 0.4 mg via ORAL
  Filled 2017-12-28 (×2): qty 1

## 2017-12-28 MED ORDER — DEXAMETHASONE SODIUM PHOSPHATE 10 MG/ML IJ SOLN
INTRAMUSCULAR | Status: AC
  Filled 2017-12-28: qty 1

## 2017-12-28 MED ORDER — CYCLOBENZAPRINE HCL 5 MG PO TABS
5.0000 mg | ORAL_TABLET | Freq: Three times a day (TID) | ORAL | Status: DC | PRN
  Administered 2017-12-29 – 2017-12-30 (×2): 5 mg via ORAL
  Filled 2017-12-28 (×4): qty 1

## 2017-12-28 MED ORDER — MENTHOL 3 MG MT LOZG: 1.0000 | LOZENGE | OROMUCOSAL | Status: DC | PRN

## 2017-12-28 MED ORDER — BUPIVACAINE LIPOSOME 1.3 % IJ SUSP
20.0000 mL | INTRAMUSCULAR | Status: AC
  Administered 2017-12-28: 20 mL
  Filled 2017-12-28: qty 20

## 2017-12-28 MED ORDER — LIDOCAINE 2% (20 MG/ML) 5 ML SYRINGE
INTRAMUSCULAR | Status: AC
  Filled 2017-12-28: qty 5

## 2017-12-28 MED ORDER — ALBUTEROL SULFATE (2.5 MG/3ML) 0.083% IN NEBU: 3.0000 mL | INHALATION_SOLUTION | RESPIRATORY_TRACT | Status: DC | PRN

## 2017-12-28 MED ORDER — DOCUSATE SODIUM 100 MG PO CAPS
100.0000 mg | ORAL_CAPSULE | Freq: Two times a day (BID) | ORAL | Status: DC
  Administered 2017-12-28 – 2017-12-30 (×4): 100 mg via ORAL
  Filled 2017-12-28 (×4): qty 1

## 2017-12-28 MED ORDER — SODIUM CHLORIDE 0.9 % IR SOLN
Status: DC | PRN
  Administered 2017-12-28: 3000 mL

## 2017-12-28 MED ORDER — BUPIVACAINE-EPINEPHRINE 0.5% -1:200000 IJ SOLN
INTRAMUSCULAR | Status: AC
  Filled 2017-12-28: qty 1

## 2017-12-28 MED ORDER — TOBRAMYCIN SULFATE 1.2 G IJ SOLR
INTRAMUSCULAR | Status: DC | PRN
  Administered 2017-12-28: 1.2 g

## 2017-12-28 MED ORDER — METOCLOPRAMIDE HCL 5 MG PO TABS: 5.0000 mg | ORAL_TABLET | Freq: Three times a day (TID) | ORAL | Status: DC | PRN

## 2017-12-28 MED ORDER — MIDAZOLAM HCL 5 MG/5ML IJ SOLN
INTRAMUSCULAR | Status: DC | PRN
  Administered 2017-12-28: 2 mg via INTRAVENOUS

## 2017-12-28 MED ORDER — ONDANSETRON HCL 4 MG/2ML IJ SOLN
INTRAMUSCULAR | Status: DC | PRN
  Administered 2017-12-28: 4 mg via INTRAVENOUS

## 2017-12-28 MED ORDER — ONDANSETRON HCL 4 MG/2ML IJ SOLN
4.0000 mg | Freq: Four times a day (QID) | INTRAMUSCULAR | Status: DC | PRN
  Administered 2017-12-29: 4 mg via INTRAVENOUS
  Filled 2017-12-28: qty 2

## 2017-12-28 MED ORDER — PROPOFOL 10 MG/ML IV BOLUS
INTRAVENOUS | Status: DC | PRN
  Administered 2017-12-28: 20 mg via INTRAVENOUS
  Administered 2017-12-28: 30 mg via INTRAVENOUS
  Administered 2017-12-28: 20 mg via INTRAVENOUS
  Administered 2017-12-28: 30 mg via INTRAVENOUS

## 2017-12-28 MED ORDER — FENTANYL CITRATE (PF) 100 MCG/2ML IJ SOLN
25.0000 ug | INTRAMUSCULAR | Status: DC | PRN
  Administered 2017-12-28 (×2): 25 ug via INTRAVENOUS

## 2017-12-28 MED ORDER — PROPOFOL 500 MG/50ML IV EMUL
INTRAVENOUS | Status: DC | PRN
  Administered 2017-12-28: 75 ug/kg/min via INTRAVENOUS

## 2017-12-28 MED ORDER — OXYCODONE HCL 5 MG PO TABS
5.0000 mg | ORAL_TABLET | ORAL | Status: DC | PRN
  Administered 2017-12-28: 10 mg via ORAL
  Administered 2017-12-28 (×2): 5 mg via ORAL
  Administered 2017-12-29 – 2017-12-30 (×4): 10 mg via ORAL
  Filled 2017-12-28 (×2): qty 2
  Filled 2017-12-28: qty 1
  Filled 2017-12-28 (×4): qty 2

## 2017-12-28 MED ORDER — ONDANSETRON HCL 4 MG/2ML IJ SOLN
INTRAMUSCULAR | Status: AC
  Filled 2017-12-28: qty 2

## 2017-12-28 MED ORDER — CHLORHEXIDINE GLUCONATE 4 % EX LIQD: 60.0000 mL | Freq: Once | CUTANEOUS | Status: DC

## 2017-12-28 MED ORDER — PHENOL 1.4 % MT LIQD: 1.0000 | OROMUCOSAL | Status: DC | PRN

## 2017-12-28 MED ORDER — MIDAZOLAM HCL 2 MG/2ML IJ SOLN
INTRAMUSCULAR | Status: AC
  Filled 2017-12-28: qty 2

## 2017-12-28 MED ORDER — BUPIVACAINE-EPINEPHRINE 0.25% -1:200000 IJ SOLN
INTRAMUSCULAR | Status: DC | PRN
  Administered 2017-12-28: 50 mL

## 2017-12-28 MED ORDER — GABAPENTIN 300 MG PO CAPS
300.0000 mg | ORAL_CAPSULE | Freq: Every day | ORAL | Status: DC
  Administered 2017-12-28 – 2017-12-29 (×2): 300 mg via ORAL
  Filled 2017-12-28 (×2): qty 1

## 2017-12-28 MED ORDER — LORAZEPAM 1 MG PO TABS: 1.0000 mg | ORAL_TABLET | Freq: Four times a day (QID) | ORAL | Status: DC | PRN

## 2017-12-28 MED ORDER — PROMETHAZINE HCL 25 MG/ML IJ SOLN: 6.2500 mg | INTRAMUSCULAR | Status: DC | PRN

## 2017-12-28 MED ORDER — LACTATED RINGERS IV SOLN
INTRAVENOUS | Status: DC
  Administered 2017-12-28 (×2): via INTRAVENOUS

## 2017-12-28 MED ORDER — BUPIVACAINE-EPINEPHRINE 0.25% -1:200000 IJ SOLN
INTRAMUSCULAR | Status: AC
  Filled 2017-12-28: qty 1

## 2017-12-28 MED ORDER — DEXAMETHASONE SODIUM PHOSPHATE 10 MG/ML IJ SOLN
INTRAMUSCULAR | Status: DC | PRN
  Administered 2017-12-28: 10 mg via INTRAVENOUS

## 2017-12-28 MED ORDER — BUPIVACAINE IN DEXTROSE 0.75-8.25 % IT SOLN
INTRATHECAL | Status: DC | PRN
  Administered 2017-12-28: 1.8 mL via INTRATHECAL

## 2017-12-28 MED ORDER — DIPHENHYDRAMINE HCL 12.5 MG/5ML PO ELIX: 12.5000 mg | ORAL_SOLUTION | ORAL | Status: DC | PRN

## 2017-12-28 MED ORDER — TRANEXAMIC ACID-NACL 1000-0.7 MG/100ML-% IV SOLN
1000.0000 mg | Freq: Once | INTRAVENOUS | Status: AC
  Administered 2017-12-28: 1000 mg via INTRAVENOUS
  Filled 2017-12-28: qty 100

## 2017-12-28 MED ORDER — OXYCODONE HCL 5 MG PO TABS: 5.0000 mg | ORAL_TABLET | Freq: Once | ORAL | Status: DC | PRN

## 2017-12-28 MED ORDER — FOLIC ACID 5 MG/ML IJ SOLN
1.0000 mg | Freq: Every day | INTRAMUSCULAR | Status: DC
  Administered 2017-12-28 – 2017-12-29 (×2): 1 mg via INTRAVENOUS
  Filled 2017-12-28 (×2): qty 0.2

## 2017-12-28 MED ORDER — FENTANYL CITRATE (PF) 100 MCG/2ML IJ SOLN
INTRAMUSCULAR | Status: DC | PRN
  Administered 2017-12-28: 100 ug via INTRAVENOUS

## 2017-12-28 MED ORDER — TOBRAMYCIN SULFATE 1.2 G IJ SOLR
INTRAMUSCULAR | Status: AC
  Filled 2017-12-28: qty 1.2

## 2017-12-28 MED ORDER — ONDANSETRON HCL 4 MG PO TABS: 4.0000 mg | ORAL_TABLET | Freq: Four times a day (QID) | ORAL | Status: DC | PRN

## 2017-12-28 MED ORDER — CEFAZOLIN SODIUM-DEXTROSE 2-4 GM/100ML-% IV SOLN
2.0000 g | INTRAVENOUS | Status: AC
  Administered 2017-12-28: 2 g via INTRAVENOUS
  Filled 2017-12-28: qty 100

## 2017-12-28 MED ORDER — POLYETHYLENE GLYCOL 3350 17 G PO PACK
17.0000 g | PACK | Freq: Two times a day (BID) | ORAL | Status: DC
  Administered 2017-12-29 – 2017-12-30 (×3): 17 g via ORAL
  Filled 2017-12-28 (×4): qty 1

## 2017-12-28 MED ORDER — METOCLOPRAMIDE HCL 5 MG/ML IJ SOLN: 5.0000 mg | Freq: Three times a day (TID) | INTRAMUSCULAR | Status: DC | PRN

## 2017-12-28 MED ORDER — DEXAMETHASONE SODIUM PHOSPHATE 10 MG/ML IJ SOLN
10.0000 mg | Freq: Three times a day (TID) | INTRAMUSCULAR | Status: AC
  Administered 2017-12-28 – 2017-12-29 (×4): 10 mg via INTRAVENOUS
  Filled 2017-12-28 (×4): qty 1

## 2017-12-28 MED ORDER — SODIUM CHLORIDE 0.9 % IJ SOLN
INTRAMUSCULAR | Status: DC | PRN
  Administered 2017-12-28: 50 mL

## 2017-12-28 MED ORDER — ACETAMINOPHEN 500 MG PO TABS
1000.0000 mg | ORAL_TABLET | Freq: Four times a day (QID) | ORAL | Status: AC
  Administered 2017-12-28 – 2017-12-29 (×4): 1000 mg via ORAL
  Filled 2017-12-28 (×4): qty 2

## 2017-12-28 MED ORDER — POTASSIUM CHLORIDE IN NACL 20-0.9 MEQ/L-% IV SOLN
INTRAVENOUS | Status: DC
  Administered 2017-12-28 – 2017-12-29 (×2): via INTRAVENOUS
  Filled 2017-12-28 (×2): qty 1000

## 2017-12-28 MED ORDER — PROPOFOL 1000 MG/100ML IV EMUL
INTRAVENOUS | Status: AC
  Filled 2017-12-28: qty 100

## 2017-12-28 MED ORDER — CEFAZOLIN SODIUM-DEXTROSE 2-4 GM/100ML-% IV SOLN
2.0000 g | Freq: Four times a day (QID) | INTRAVENOUS | Status: AC
  Administered 2017-12-28 (×2): 2 g via INTRAVENOUS
  Filled 2017-12-28 (×2): qty 100

## 2017-12-28 MED ORDER — ALUM & MAG HYDROXIDE-SIMETH 200-200-20 MG/5ML PO SUSP: 30.0000 mL | ORAL | Status: DC | PRN

## 2017-12-28 MED ORDER — POVIDONE-IODINE 7.5 % EX SOLN
Freq: Once | CUTANEOUS | Status: DC
  Filled 2017-12-28: qty 118

## 2017-12-28 MED ORDER — OXYCODONE HCL 5 MG/5ML PO SOLN: 5.0000 mg | Freq: Once | ORAL | Status: DC | PRN

## 2017-12-28 SURGICAL SUPPLY — 79 items
ATTUNE PS FEM LT SZ 7 CEM KNEE (Femur) ×3 IMPLANT
ATTUNE PSRP INSR SZ7 6 KNEE (Insert) ×2 IMPLANT
ATTUNE PSRP INSR SZ7 6MM KNEE (Insert) ×1 IMPLANT
BANDAGE ESMARK 6X9 LF (GAUZE/BANDAGES/DRESSINGS) ×1 IMPLANT
BASE TIBIAL ROT PLAT SZ 7 KNEE (Knees) ×1 IMPLANT
BENZOIN TINCTURE PRP APPL 2/3 (GAUZE/BANDAGES/DRESSINGS) ×3 IMPLANT
BLADE HEX COATED 2.75 (ELECTRODE) ×3 IMPLANT
BLADE SAGITTAL 25.0X1.19X90 (BLADE) ×2 IMPLANT
BLADE SAGITTAL 25.0X1.19X90MM (BLADE) ×1
BLADE SAW SGTL 13X75X1.27 (BLADE) ×3 IMPLANT
BLADE SURG 10 STRL SS (BLADE) ×15 IMPLANT
BNDG ELASTIC 6X10 VLCR STRL LF (GAUZE/BANDAGES/DRESSINGS) ×3 IMPLANT
BNDG ELASTIC 6X15 VLCR STRL LF (GAUZE/BANDAGES/DRESSINGS) ×3 IMPLANT
BNDG ESMARK 6X9 LF (GAUZE/BANDAGES/DRESSINGS) ×3
BOWL SMART MIX CTS (DISPOSABLE) ×3 IMPLANT
CEMENT HV SMART SET (Cement) ×6 IMPLANT
CLOSURE WOUND 1/2 X4 (GAUZE/BANDAGES/DRESSINGS) ×1
COVER SURGICAL LIGHT HANDLE (MISCELLANEOUS) ×6 IMPLANT
COVER WAND RF STERILE (DRAPES) ×3 IMPLANT
CUFF TOURNIQUET SINGLE 34IN LL (TOURNIQUET CUFF) ×3 IMPLANT
CUFF TOURNIQUET SINGLE 44IN (TOURNIQUET CUFF) IMPLANT
DECANTER SPIKE VIAL GLASS SM (MISCELLANEOUS) ×3 IMPLANT
DRAPE EXTREMITY T 121X128X90 (DRAPE) ×3 IMPLANT
DRAPE HALF SHEET 40X57 (DRAPES) ×6 IMPLANT
DRAPE INCISE IOBAN 66X45 STRL (DRAPES) IMPLANT
DRAPE ORTHO SPLIT 77X108 STRL (DRAPES) ×2
DRAPE SURG ORHT 6 SPLT 77X108 (DRAPES) ×1 IMPLANT
DRAPE U-SHAPE 47X51 STRL (DRAPES) ×3 IMPLANT
DRSG AQUACEL AG ADV 3.5X10 (GAUZE/BANDAGES/DRESSINGS) ×3 IMPLANT
DURAPREP 26ML APPLICATOR (WOUND CARE) ×3 IMPLANT
ELECT CAUTERY BLADE 6.4 (BLADE) ×3 IMPLANT
ELECT REM PT RETURN 9FT ADLT (ELECTROSURGICAL) ×3
ELECTRODE REM PT RTRN 9FT ADLT (ELECTROSURGICAL) ×1 IMPLANT
FACESHIELD WRAPAROUND (MASK) ×3 IMPLANT
GLOVE BIO SURGEON STRL SZ7 (GLOVE) ×3 IMPLANT
GLOVE BIOGEL PI IND STRL 7.0 (GLOVE) ×1 IMPLANT
GLOVE BIOGEL PI IND STRL 7.5 (GLOVE) ×1 IMPLANT
GLOVE BIOGEL PI INDICATOR 7.0 (GLOVE) ×2
GLOVE BIOGEL PI INDICATOR 7.5 (GLOVE) ×2
GLOVE SS BIOGEL STRL SZ 7.5 (GLOVE) ×1 IMPLANT
GLOVE SUPERSENSE BIOGEL SZ 7.5 (GLOVE) ×2
GOWN STRL REUS W/ TWL LRG LVL3 (GOWN DISPOSABLE) ×1 IMPLANT
GOWN STRL REUS W/ TWL XL LVL3 (GOWN DISPOSABLE) ×1 IMPLANT
GOWN STRL REUS W/TWL LRG LVL3 (GOWN DISPOSABLE) ×2
GOWN STRL REUS W/TWL XL LVL3 (GOWN DISPOSABLE) ×2
HANDPIECE INTERPULSE COAX TIP (DISPOSABLE) ×2
HOOD PEEL AWAY FACE SHEILD DIS (HOOD) ×6 IMPLANT
IMMOBILIZER KNEE 22 UNIV (SOFTGOODS) ×3 IMPLANT
KIT BASIN OR (CUSTOM PROCEDURE TRAY) ×3 IMPLANT
KIT TURNOVER KIT B (KITS) ×3 IMPLANT
MANIFOLD NEPTUNE II (INSTRUMENTS) ×3 IMPLANT
MARKER SKIN DUAL TIP RULER LAB (MISCELLANEOUS) ×3 IMPLANT
NEEDLE HYPO 22GX1.5 SAFETY (NEEDLE) ×6 IMPLANT
NS IRRIG 1000ML POUR BTL (IV SOLUTION) ×3 IMPLANT
PACK TOTAL JOINT (CUSTOM PROCEDURE TRAY) ×3 IMPLANT
PAD ARMBOARD 7.5X6 YLW CONV (MISCELLANEOUS) ×6 IMPLANT
PATELLA MEDIAL ATTUN 35MM KNEE (Knees) ×3 IMPLANT
PIN STEINMAN FIXATION KNEE (PIN) ×3 IMPLANT
PIN THREADED HEADED SIGMA (PIN) ×3 IMPLANT
SET HNDPC FAN SPRY TIP SCT (DISPOSABLE) ×1 IMPLANT
STRIP CLOSURE SKIN 1/2X4 (GAUZE/BANDAGES/DRESSINGS) ×2 IMPLANT
SUCTION FRAZIER HANDLE 10FR (MISCELLANEOUS) ×2
SUCTION TUBE FRAZIER 10FR DISP (MISCELLANEOUS) ×1 IMPLANT
SUT MNCRL AB 3-0 PS2 18 (SUTURE) ×3 IMPLANT
SUT VIC AB 0 CT1 27 (SUTURE) ×4
SUT VIC AB 0 CT1 27XBRD ANBCTR (SUTURE) ×2 IMPLANT
SUT VIC AB 1 CT1 27 (SUTURE) ×2
SUT VIC AB 1 CT1 27XBRD ANBCTR (SUTURE) ×1 IMPLANT
SUT VIC AB 2-0 CT1 27 (SUTURE) ×4
SUT VIC AB 2-0 CT1 TAPERPNT 27 (SUTURE) ×2 IMPLANT
SYR CONTROL 10ML LL (SYRINGE) ×9 IMPLANT
TAPE STRIPS DRAPE STRL (GAUZE/BANDAGES/DRESSINGS) ×3 IMPLANT
TIBIAL BASE ROT PLAT SZ 7 KNEE (Knees) ×3 IMPLANT
TOWEL OR 17X24 6PK STRL BLUE (TOWEL DISPOSABLE) ×3 IMPLANT
TOWEL OR 17X26 10 PK STRL BLUE (TOWEL DISPOSABLE) ×3 IMPLANT
TRAY CATH 16FR W/PLASTIC CATH (SET/KITS/TRAYS/PACK) IMPLANT
TRAY FOLEY CATH 14FR (SET/KITS/TRAYS/PACK) ×3 IMPLANT
TRAY FOLEY CATH SILVER 16FR (SET/KITS/TRAYS/PACK) IMPLANT
WATER STERILE IRR 1000ML POUR (IV SOLUTION) IMPLANT

## 2017-12-28 NOTE — Anesthesia Procedure Notes (Signed)
Spinal  Patient location during procedure: OR Start time: 12/28/2017 7:25 AM End time: 12/28/2017 7:28 AM Staffing Anesthesiologist: Kaylyn Layer, MD Performed: anesthesiologist  Preanesthetic Checklist Completed: patient identified, surgical consent, pre-op evaluation, timeout performed, IV checked, risks and benefits discussed and monitors and equipment checked Spinal Block Patient position: sitting Prep: site prepped and draped and DuraPrep Patient monitoring: cardiac monitor, continuous pulse ox and blood pressure Approach: midline Location: L2-3 Injection technique: single-shot Needle Needle type: Pencan  Needle gauge: 24 G Needle length: 9 cm Additional Notes Risks, benefits, and alternative discussed. Patient gave consent to procedure. Prepped and draped in sitting position. First attempt at L3-4 with persistent os encountered, clear CSF on first attempt at L2-3. Positive terminal aspiration. No pain or paraesthesias with injection. Patient tolerated procedure well. Vital signs stable. Amalia Greenhouse, MD

## 2017-12-28 NOTE — Op Note (Signed)
MRN:     [id number] DOB/AGE:    1956/01/03 / 62 y.o.       OPERATIVE REPORT   DATE OF PROCEDURE:  12/28/2017      PREOPERATIVE DIAGNOSIS:   Primary Localized Osteoarthritis left Knee       Estimated body mass index is 37.36 kg/m as calculated from the following:   Height as of this encounter: 5\' 8"  (1.727 m).   Weight as of this encounter: 111.4 kg.                                                       POSTOPERATIVE DIAGNOSIS:   Same                                                                 PROCEDURE:  Procedure(s): TOTAL KNEE ARTHROPLASTY Using Depuy Attune RP implants #7 Femur, #7Tibia, 6mm  RP bearing, 35 Patella    SURGEON: Nethan Caudillo A. Thurston Hole, MD   ASSISTANT: Julien Girt, PA-C, present and scrubbed throughout the case, critical for retraction, instrumentation, and closure.  ANESTHESIA: Spinal with Adductor Nerve Block  TOURNIQUET TIME: 70 minutes   COMPLICATIONS:  None       SPECIMENS: None   INDICATIONS FOR PROCEDURE: The patient has djd of the knee with varus deformities, XR shows bone on bone arthritis. Patient has failed all conservative measures including anti-inflammatory medicines, narcotics, attempts at exercise and weight loss, cortisone injections and viscosupplementation.  Risks and benefits of surgery have been discussed, questions answered.    DESCRIPTION OF PROCEDURE: The patient identified by armband, received right adductor canal block and IV antibiotics, in the holding area at [facility]. Patient taken to the operating room, appropriate anesthetic monitors were attached. Spinal anesthesia induced with the patient in supine position, Foley catheter was inserted. Tourniquet applied high to the operative thigh. Lateral post and foot positioner applied to the table, the lower extremity was then prepped and draped in usual sterile fashion from the ankle to the tourniquet. Time-out procedure was performed. The limb was wrapped with an Esmarch bandage  and the tourniquet inflated to 365 mmHg.   We began the operation by making a 6cm anterior midline incision. Small bleeders in the skin and the subcutaneous tissue identified and cauterized. Transverse retinaculum was incised and reflected medially and a medial parapatellar arthrotomy was accomplished. the patella was everted and theprepatellar fat pad resected. The superficial medial collateral ligament was then elevated from anterior to posterior along the proximal flare of the tibia and anterior half of the menisci resected. The knee was hyperflexed exposing bone on bone arthritis. Peripheral and notch osteophytes as well as the cruciate ligaments were then resected. We continued to work our way around posteriorly along the proximal tibia, and externally rotated the tibia subluxing it out from underneath the femur. A McHale retractor was placed through the notch and a lateral Hohmann retractor placed, and an external tibial guide was placed.  The tibial cutting guide was pinned into place allowing resection of 4 mm of bone medially and about 6 mm of bone laterally because of her  varus deformity.   Satisfied with the tibial resection, we then entered the distal femur 2 mm anterior to the PCL origin with the intramedullary guide rod and applied the distal femoral cutting guide set at 11mm, with 5 degrees of valgus. This was pinned along the epicondylar axis. At this point, the distal femoral cut was accomplished without difficulty. We then sized for a 7 femoral component and pinned the guide in 3 degrees of external rotation.The chamfer cutting guide was pinned into place. The anterior, posterior, and chamfer cuts were accomplished without difficulty followed by the  RP box cutting guide and the box cut. We also removed posterior osteophytes from the posterior femoral condyles. At this time, the knee was brought into full extension. We checked our extension and flexion gaps and found them symmetric at 6.  The  patella thickness measured at 26m m. We set the cutting guide at 15 and removed the posterior patella sized for 35 button and drilled the lollipop. The knee was then once again hyperflexed exposing the proximal tibia. We sized for a # 7 tibial base plate, applied the smokestack and the conical reamer followed by the the Delta fin keel punch. We then hammered into place the  RP trial femoral component, inserted a trial bearing, trial patellar button, and took the knee through range of motion from 0-130 degrees. No thumb pressure was required for patellar tracking.   At this point, all trial components were removed, a double batch of DePuy HV cement with Tobramycin 1.2gms due to previous history of UTI  was mixed and applied to all bony metallic mating surfaces. In order, we hammered into place the tibial tray and removed excess cement, the femoral component and removed excess cement, a 6 mm  RP bearing was inserted, and the knee brought to full extension with compression. The patellar button was clamped into place, and excess cement removed. While the cement cured the wound was irrigated out with normal saline solution pulse lavage, and exparel was injected throughout the knee. Ligament stability and patellar tracking were checked and found to be excellent..   The parapatellar arthrotomy was closed with  #1 Vicryl suture. The subcutaneous tissue with 0 and 2-0 undyed Vicryl suture, and 4-0 Monocryl.. A dressing of Aquaseal, 4 x 4, dressing sponges, Webril, and Ace wrap applied. Needle and sponge count were correct times 2.The patient awakened, extubated, and taken to recovery room without difficulty. Vascular status was normal, pulses 2+ and symmetric.    Nilda Simmer 05/18/2017, 8:56 AM

## 2017-12-28 NOTE — Anesthesia Postprocedure Evaluation (Signed)
Anesthesia Post Note  Patient: Cody Diaz  Procedure(s) Performed: TOTAL KNEE ARTHROPLASTY (Left Knee)     Patient location during evaluation: PACU Anesthesia Type: Spinal Level of consciousness: awake and alert Pain management: pain level controlled Vital Signs Assessment: post-procedure vital signs reviewed and stable Respiratory status: spontaneous breathing, nonlabored ventilation and respiratory function stable Cardiovascular status: blood pressure returned to baseline and stable Postop Assessment: no apparent nausea or vomiting, spinal receding, no headache and no backache Anesthetic complications: no    Last Vitals:  Vitals:   12/28/17 0940 12/28/17 0955  BP: 120/83 138/90  Pulse: 88 65  Resp: 17 11  Temp: (!) 36.1 C   SpO2: 96% 98%    Last Pain:  Vitals:   12/28/17 0940  TempSrc:   PainSc: 0-No pain                 Kaylyn Layer

## 2017-12-28 NOTE — Interval H&P Note (Signed)
History and Physical Interval Note:  12/28/2017 6:19 AM  Cody Diaz  has presented today for surgery, with the diagnosis of djd left knee  The various methods of treatment have been discussed with the patient and family. After consideration of risks, benefits and other options for treatment, the patient has consented to  Procedure(s): TOTAL KNEE ARTHROPLASTY (Left) as a surgical intervention .  The patient's history has been reviewed, patient examined, no change in status, stable for surgery.  I have reviewed the patient's chart and labs.  Questions were answered to the patient's satisfaction.     Nilda Simmer

## 2017-12-28 NOTE — Evaluation (Signed)
Physical Therapy Evaluation Patient Details Name: Cody Diaz MRN: [id number] DOB: [birth year] Today's Date: 12/28/2017   History of Present Illness  Pt is a 62 y/o male s/p L TKA. PMH includes HTN.   Clinical Impression  Pt is s/p surgery above with deficits below. Pt reporting wooziness and increased pain, so mobility limited to chair. Required min guard A for mobility using RW. Educated about knee precautions and supine HEP. Will continue to follow acutely to maximize functional mobility independence and safety.     Follow Up Recommendations Follow surgeon's recommendation for DC plan and follow-up therapies;Supervision for mobility/OOB    Equipment Recommendations  None recommended by PT    Recommendations for Other Services       Precautions / Restrictions Precautions Precautions: Knee Precaution Booklet Issued: Yes (comment) Precaution Comments: Reviewed knee precautions with pt and supine HEP.  Required Braces or Orthoses: Knee Immobilizer - Left Knee Immobilizer - Left: Other (comment)(until good quad control ) Restrictions Weight Bearing Restrictions: Yes LLE Weight Bearing: Weight bearing as tolerated      Mobility  Bed Mobility Overal bed mobility: Needs Assistance Bed Mobility: Supine to Sit     Supine to sit: Supervision     General bed mobility comments: Supervision for safety.   Transfers Overall transfer level: Needs assistance Equipment used: Rolling walker (2 wheeled) Transfers: Sit to/from Stand Sit to Stand: Min guard         General transfer comment: Min guard for safety. Increased time required. Verbal cuues for safe hand placement.   Ambulation/Gait Ambulation/Gait assistance: Min guard Gait Distance (Feet): 5 Feet Assistive device: Rolling walker (2 wheeled) Gait Pattern/deviations: Step-to pattern;Decreased step length - right;Decreased step length - left;Decreased weight shift to left;Antalgic Gait velocity: Decreased    General Gait Details: Slow, antalgic gait. Verbal cues for sequencing using RW. Pt reports wooziness and increased pain, so mobility limited to chair.   Stairs            Wheelchair Mobility    Modified Rankin (Stroke Patients Only)       Balance Overall balance assessment: Needs assistance Sitting-balance support: No upper extremity supported;Feet supported Sitting balance-Leahy Scale: Good     Standing balance support: Bilateral upper extremity supported;During functional activity Standing balance-Leahy Scale: Poor Standing balance comment: Reliant on BUE support                              Pertinent Vitals/Pain Pain Assessment: 0-10 Pain Score: 7  Pain Location: L knee  Pain Descriptors / Indicators: Aching;Operative site guarding Pain Intervention(s): Limited activity within patient's tolerance;Monitored during session;Repositioned    Home Living Family/patient expects to be discharged to:: Private residence Living Arrangements: Parent Available Help at Discharge: Family;Available 24 hours/day Type of Home: House Home Access: Stairs to enter Entrance Stairs-Rails: Doctor, general practice of Steps: 2 Home Layout: One level Home Equipment: Walker - 2 wheels;Crutches;Tub bench;Cane - single point;Bedside commode Additional Comments: Reports he is the primary caregiver for mom, but reports his aunt will be able to check in.     Prior Function Level of Independence: Independent with assistive device(s)         Comments: Used crutches secondary to knee pain     Hand Dominance        Extremity/Trunk Assessment   Upper Extremity Assessment Upper Extremity Assessment: Overall WFL for tasks assessed    Lower Extremity Assessment Lower Extremity Assessment: LLE deficits/detail LLE  Deficits / Details: Reports sensation in tact. Deficits consistent with post op pain and weakness. Able to perform ther ex below.     Cervical / Trunk  Assessment Cervical / Trunk Assessment: Normal  Communication   Communication: No difficulties  Cognition Arousal/Alertness: Awake/alert Behavior During Therapy: WFL for tasks assessed/performed Overall Cognitive Status: Within Functional Limits for tasks assessed                                        General Comments      Exercises Total Joint Exercises Ankle Circles/Pumps: AROM;Both;20 reps;Supine Quad Sets: AROM;Left;10 reps;Supine   Assessment/Plan    PT Assessment Patient needs continued PT services  PT Problem List Decreased strength;Decreased balance;Decreased range of motion;Decreased activity tolerance;Decreased mobility;Decreased knowledge of use of DME;Decreased knowledge of precautions;Pain       PT Treatment Interventions DME instruction;Gait training;Stair training;Therapeutic activities;Therapeutic exercise;Functional mobility training;Balance training;Patient/family education    PT Goals (Current goals can be found in the Care Plan section)  Acute Rehab PT Goals Patient Stated Goal: to go home PT Goal Formulation: With patient Time For Goal Achievement: 01/11/18 Potential to Achieve Goals: Good    Frequency 7X/week   Barriers to discharge        Co-evaluation               AM-PAC PT "6 Clicks" Daily Activity  Outcome Measure Difficulty turning over in bed (including adjusting bedclothes, sheets and blankets)?: A Little Difficulty moving from lying on back to sitting on the side of the bed? : A Little Difficulty sitting down on and standing up from a chair with arms (e.g., wheelchair, bedside commode, etc,.)?: Unable Help needed moving to and from a bed to chair (including a wheelchair)?: A Little Help needed walking in hospital room?: A Little Help needed climbing 3-5 steps with a railing? : A Lot 6 Click Score: 15    End of Session Equipment Utilized During Treatment: Gait belt;Left knee immobilizer Activity Tolerance:  Patient limited by pain;Treatment limited secondary to medical complications (Comment)(wooziness ) Patient left: in chair;with call bell/phone within reach Nurse Communication: Mobility status PT Visit Diagnosis: Other abnormalities of gait and mobility (R26.89);Pain Pain - Right/Left: Left Pain - part of body: Knee    Time: 9562-1308 PT Time Calculation (min) (ACUTE ONLY): 23 min   Charges:   PT Evaluation $PT Eval Low Complexity: 1 Low PT Treatments $Therapeutic Activity: 8-22 mins        Gladys Damme, PT, DPT  Acute Rehabilitation Services  Pager: [phone] Office: [phone]   Lehman Prom 12/28/2017, 4:06 PM

## 2017-12-28 NOTE — Anesthesia Procedure Notes (Signed)
Anesthesia Regional Block: Adductor canal block   Pre-Anesthetic Checklist: ,, timeout performed, Correct Patient, Correct Site, Correct Laterality, Correct Procedure, Correct Position, site marked, Risks and benefits discussed, pre-op evaluation,  At surgeon's request and post-op pain management  Laterality: Left  Prep: Maximum Sterile Barrier Precautions used, chloraprep       Needles:  Injection technique: Single-shot  Needle Type: Echogenic Stimulator Needle     Needle Length: 9cm  Needle Gauge: 22     Additional Needles:   Procedures:,,,, ultrasound used (permanent image in chart),,,,  Narrative:  Start time: 12/28/2017 7:00 AM End time: 12/28/2017 7:02 AM Injection made incrementally with aspirations every 5 mL.  Performed by: Personally  Anesthesiologist: Kaylyn Layer, MD  Additional Notes: Risks, benefits, and alternative discussed. Patient gave consent for procedure. Patient prepped and draped in sterile fashion. Sedation administered, patient remains easily responsive to voice. Relevant anatomy identified with ultrasound guidance. Local anesthetic given in 5cc increments with no signs or symptoms of intravascular injection. No pain or paraesthesias with injection. Patient monitored throughout procedure with signs of LAST or immediate complications. Tolerated well. Ultrasound image placed in chart.  Amalia Greenhouse, MD

## 2017-12-28 NOTE — Progress Notes (Signed)
Orthopedic Tech Progress Note Patient Details:  Cody Diaz 06-23-1955 [id number]  CPM Left Knee CPM Left Knee: On Left Knee Flexion (Degrees): 90 Left Knee Extension (Degrees): 0 Additional Comments: trapeze bar patient helper  Post Interventions Patient Tolerated: Well Instructions Provided: Care of device Viewed order from doctor's order list Nikki Dom 12/28/2017, 12:31 PM

## 2017-12-28 NOTE — Transfer of Care (Signed)
Immediate Anesthesia Transfer of Care Note  Patient: Rosanne Gutting  Procedure(s) Performed: TOTAL KNEE ARTHROPLASTY (Left Knee)  Patient Location: PACU  Anesthesia Type:Spinal and MAC combined with regional for post-op pain  Level of Consciousness: awake and patient cooperative  Airway & Oxygen Therapy: Patient Spontanous Breathing and Patient connected to nasal cannula oxygen  Post-op Assessment: Report given to RN, Post -op Vital signs reviewed and stable and Patient moving all extremities  Post vital signs: Reviewed and stable  Last Vitals:  Vitals Value Taken Time  BP 120/83 12/28/2017  9:40 AM  Temp    Pulse 85 12/28/2017  9:42 AM  Resp 15 12/28/2017  9:42 AM  SpO2 97 % 12/28/2017  9:42 AM  Vitals shown include unvalidated device data.  Last Pain:  Vitals:   12/28/17 0608  TempSrc:   PainSc: 3          Complications: No apparent anesthesia complications

## 2017-12-28 NOTE — Progress Notes (Signed)
1235 Received pt from PACU, A&O x4. Left leg with compression wrap dry and intact. CPM on. C/o pain to left knee, medicated.

## 2017-12-29 ENCOUNTER — Encounter (HOSPITAL_COMMUNITY): Payer: Self-pay | Admitting: Orthopedic Surgery

## 2017-12-29 LAB — BASIC METABOLIC PANEL
ANION GAP: 7 (ref 5–15)
BUN: 13 mg/dL (ref 8–23)
CALCIUM: 8.7 mg/dL — AB (ref 8.9–10.3)
CO2: 26 mmol/L (ref 22–32)
Chloride: 104 mmol/L (ref 98–111)
Creatinine, Ser: 0.94 mg/dL (ref 0.61–1.24)
GFR calc non Af Amer: >60 mL/min (ref >60)
GLUCOSE: 165 mg/dL — AB (ref 70–99)
POTASSIUM: 4.7 mmol/L (ref 3.5–5.1)
Sodium: 137 mmol/L (ref 135–145)

## 2017-12-29 LAB — CBC
HCT: 41.9 % (ref 39.0–52.0)
Hemoglobin: 13.3 g/dL (ref 13.0–17.0)
MCH: 30.4 pg (ref 26.0–34.0)
MCHC: 31.7 g/dL (ref 30.0–36.0)
MCV: 95.7 fL (ref 80.0–100.0)
NRBC: 0 % (ref 0.0–0.2)
PLATELETS: 215 10*3/uL (ref 150–400)
RBC: 4.38 MIL/uL (ref 4.22–5.81)
RDW: 13.3 % (ref 11.5–15.5)
WBC: 14.9 10*3/uL — AB (ref 4.0–10.5)

## 2017-12-29 MED ORDER — FOLIC ACID 1 MG PO TABS
1.0000 mg | ORAL_TABLET | Freq: Every day | ORAL | Status: DC
  Administered 2017-12-30: 1 mg via ORAL
  Filled 2017-12-29: qty 1

## 2017-12-29 MED ORDER — LORAZEPAM 0.5 MG PO TABS
0.5000 mg | ORAL_TABLET | Freq: Four times a day (QID) | ORAL | Status: DC | PRN
  Administered 2017-12-29: 0.5 mg via ORAL
  Filled 2017-12-29: qty 1

## 2017-12-29 NOTE — Progress Notes (Signed)
Physical Therapy Treatment Patient Details Name: Cody Diaz MRN: [id number] DOB: 10-17-1955 Today's Date: 12/29/2017    History of Present Illness Pt is a 62 y/o male s/p L TKA. PMH includes HTN.     PT Comments    Patient seen for strengthening/ROM exercises. Pt is improving with ROM and able to complete HEP with min A at times. Continue to progress as tolerated.    Follow Up Recommendations  Follow surgeon's recommendation for DC plan and follow-up therapies;Supervision for mobility/OOB     Equipment Recommendations  None recommended by PT    Recommendations for Other Services       Precautions / Restrictions Precautions Precautions: Knee Precaution Booklet Issued: Yes (comment) Precaution Comments: Reviewed knee precautions/positioning with pt   Required Braces or Orthoses: Knee Immobilizer - Left Knee Immobilizer - Left: Other (comment)(until good quad control ) Restrictions Weight Bearing Restrictions: Yes LLE Weight Bearing: Weight bearing as tolerated    Mobility  Bed Mobility               General bed mobility comments: pt OOB in chair upon arrival  Transfers Overall transfer level: Needs assistance Equipment used: Rolling walker (2 wheeled) Transfers: Sit to/from Stand Sit to Stand: Min guard         General transfer comment: Min guard for safety; cues for safe hand placement   Ambulation/Gait Ambulation/Gait assistance: Min guard Gait Distance (Feet): 150 Feet Assistive device: Rolling walker (2 wheeled) Gait Pattern/deviations: Step-to pattern;Decreased step length - right;Decreased weight shift to left;Antalgic;Step-through pattern;Decreased stance time - left Gait velocity: Decreased   General Gait Details: cues for posture, step length symmetry, and safe use of AD; pt with improving step through pattern with increased distance   Stairs Stairs: Yes Stairs assistance: Min guard;Min assist Stair Management: One rail Left;Step to  pattern;Sideways Number of Stairs: (2 steps X 2 trials) General stair comments: cues for sequencing and technique    Wheelchair Mobility    Modified Rankin (Stroke Patients Only)       Balance Overall balance assessment: Needs assistance Sitting-balance support: No upper extremity supported;Feet supported Sitting balance-Leahy Scale: Good     Standing balance support: Bilateral upper extremity supported;During functional activity Standing balance-Leahy Scale: Poor Standing balance comment: Reliant on BUE support                             Cognition Arousal/Alertness: Awake/alert Behavior During Therapy: WFL for tasks assessed/performed Overall Cognitive Status: Within Functional Limits for tasks assessed                                        Exercises Total Joint Exercises Ankle Circles/Pumps: AROM;Left Quad Sets: Strengthening;Left;10 reps Short Arc Quad: Strengthening;Left;10 reps Heel Slides: AAROM;Left;10 reps Hip ABduction/ADduction: AROM;Strengthening;Left;10 reps Straight Leg Raises: Strengthening;Left;10 reps Long Arc Quad: Strengthening;Left;10 reps;Seated Knee Flexion: AROM;AAROM;10 reps;Seated(10 second holds) Goniometric ROM: approx 0-90    General Comments        Pertinent Vitals/Pain Pain Assessment: Faces Faces Pain Scale: (4-6 with therex) Pain Location: L knee  Pain Descriptors / Indicators: Sore;Grimacing Pain Intervention(s): Monitored during session;Premedicated before session;Repositioned    Home Living                      Prior Function  PT Goals (current goals can now be found in the care plan section) Acute Rehab PT Goals Patient Stated Goal: to go home Progress towards PT goals: Progressing toward goals    Frequency    7X/week      PT Plan Current plan remains appropriate    Co-evaluation              AM-PAC PT "6 Clicks" Daily Activity  Outcome Measure   Difficulty turning over in bed (including adjusting bedclothes, sheets and blankets)?: A Little Difficulty moving from lying on back to sitting on the side of the bed? : A Little Difficulty sitting down on and standing up from a chair with arms (e.g., wheelchair, bedside commode, etc,.)?: Unable Help needed moving to and from a bed to chair (including a wheelchair)?: A Little Help needed walking in hospital room?: A Little Help needed climbing 3-5 steps with a railing? : A Little 6 Click Score: 16    End of Session Equipment Utilized During Treatment: Gait belt Activity Tolerance: Patient tolerated treatment well Patient left: in chair;with call bell/phone within reach Nurse Communication: Mobility status PT Visit Diagnosis: Other abnormalities of gait and mobility (R26.89);Pain Pain - Right/Left: Left Pain - part of body: Knee     Time: 1610-9604 PT Time Calculation (min) (ACUTE ONLY): 26 min  Charges:   $Therapeutic Exercise: 23-37 mins                     Erline Levine, PTA Acute Rehabilitation Services Pager: [phone] Office: [phone]     Carolynne Edouard 12/29/2017, 4:37 PM

## 2017-12-29 NOTE — Plan of Care (Signed)
  Problem: Education: Goal: Knowledge of General Education information will improve Description Including pain rating scale, medication(s)/side effects and non-pharmacologic comfort measures Outcome: Progressing Note:  POC and pain management reviewed with pt.   

## 2017-12-29 NOTE — Care Management Note (Addendum)
Case Management Note  Patient Details  Name: Cody Diaz MRN: [id number] Date of Birth: April 18, 1955  Subjective/Objective:    Left TKA                Action/Plan: NCM spoke to pt and he has CPM and RW from [employer]. Gave permission to speak to his Worker's Comp CW, [employer].  HH was preoperatively arranged with [facility] from surgeon's office and auth through [employer]. Contacted his Worker's Comp Mickle Mallory [phone], fax [phone]. Faxed op note to CW. Will fax dc summary to CW, Denise at [employer].   Expected Discharge Date:                  Expected Discharge Plan:  Home w Home Health Services  In-House Referral:  NA  Discharge planning Services  CM Consult  Post Acute Care Choice:  Home Health Choice offered to:  Patient  DME Arranged:  Walker rolling, CPM DME Agency:  [employer]  HH Arranged:  PT HH Agency:  [facility]  Status of Service:  Completed, signed off  If discussed at Long Length of Stay Meetings, dates discussed:    Additional Comments:  Elliot Cousin, RN 12/29/2017, 4:30 PM

## 2017-12-29 NOTE — Progress Notes (Signed)
Physical Therapy Treatment Patient Details Name: Cody Diaz MRN: [id number] DOB: [birth year] Today's Date: 12/29/2017    History of Present Illness Pt is a 62 y/o male s/p L TKA. PMH includes HTN.     PT Comments    Patient seen for mobility progression. Pt is making progress toward PT goals and tolerated gait and stair training well. Continue to progress as tolerated.    Follow Up Recommendations  Follow surgeon's recommendation for DC plan and follow-up therapies;Supervision for mobility/OOB     Equipment Recommendations  None recommended by PT    Recommendations for Other Services       Precautions / Restrictions Precautions Precautions: Knee Precaution Booklet Issued: Yes (comment) Precaution Comments: Reviewed knee precautions/positioning with pt   Required Braces or Orthoses: Knee Immobilizer - Left Knee Immobilizer - Left: Other (comment)(until good quad control ) Restrictions Weight Bearing Restrictions: Yes LLE Weight Bearing: Weight bearing as tolerated    Mobility  Bed Mobility               General bed mobility comments: pt OOB in chair upon arrival  Transfers Overall transfer level: Needs assistance Equipment used: Rolling walker (2 wheeled) Transfers: Sit to/from Stand Sit to Stand: Min guard         General transfer comment: Min guard for safety; cues for safe hand placement   Ambulation/Gait Ambulation/Gait assistance: Min guard Gait Distance (Feet): 150 Feet Assistive device: Rolling walker (2 wheeled) Gait Pattern/deviations: Step-to pattern;Decreased step length - right;Decreased weight shift to left;Antalgic;Step-through pattern;Decreased stance time - left Gait velocity: Decreased   General Gait Details: cues for posture, step length symmetry, and safe use of AD; pt with improving step through pattern with increased distance   Stairs Stairs: Yes Stairs assistance: Min guard;Min assist Stair Management: One rail Left;Step  to pattern;Sideways Number of Stairs: (2 steps X 2 trials) General stair comments: cues for sequencing and technique    Wheelchair Mobility    Modified Rankin (Stroke Patients Only)       Balance Overall balance assessment: Needs assistance Sitting-balance support: No upper extremity supported;Feet supported Sitting balance-Leahy Scale: Good     Standing balance support: Bilateral upper extremity supported;During functional activity Standing balance-Leahy Scale: Poor Standing balance comment: Reliant on BUE support                             Cognition Arousal/Alertness: Awake/alert Behavior During Therapy: WFL for tasks assessed/performed Overall Cognitive Status: Within Functional Limits for tasks assessed                                        Exercises      General Comments        Pertinent Vitals/Pain Pain Assessment: Faces Faces Pain Scale: Hurts little more Pain Location: L knee  Pain Descriptors / Indicators: Guarding;Sore Pain Intervention(s): Limited activity within patient's tolerance;Monitored during session;Premedicated before session;Repositioned;Ice applied    Home Living                      Prior Function            PT Goals (current goals can now be found in the care plan section) Acute Rehab PT Goals Patient Stated Goal: to go home Progress towards PT goals: Progressing toward goals    Frequency  7X/week      PT Plan Current plan remains appropriate    Co-evaluation              AM-PAC PT "6 Clicks" Daily Activity  Outcome Measure  Difficulty turning over in bed (including adjusting bedclothes, sheets and blankets)?: A Little Difficulty moving from lying on back to sitting on the side of the bed? : A Little Difficulty sitting down on and standing up from a chair with arms (e.g., wheelchair, bedside commode, etc,.)?: Unable Help needed moving to and from a bed to chair (including a  wheelchair)?: A Little Help needed walking in hospital room?: A Little Help needed climbing 3-5 steps with a railing? : A Little 6 Click Score: 16    End of Session Equipment Utilized During Treatment: Gait belt Activity Tolerance: Patient tolerated treatment well Patient left: in chair;with call bell/phone within reach Nurse Communication: Mobility status PT Visit Diagnosis: Other abnormalities of gait and mobility (R26.89);Pain Pain - Right/Left: Left Pain - part of body: Knee     Time: 1131-1200 PT Time Calculation (min) (ACUTE ONLY): 29 min  Charges:  $Gait Training: 23-37 mins                     Erline Levine, PTA Acute Rehabilitation Services Pager: [phone] Office: [phone]     Carolynne Edouard 12/29/2017, 1:44 PM

## 2017-12-30 LAB — BASIC METABOLIC PANEL
Anion gap: 6 (ref 5–15)
BUN: 16 mg/dL (ref 8–23)
CHLORIDE: 102 mmol/L (ref 98–111)
CO2: 30 mmol/L (ref 22–32)
Calcium: 8.9 mg/dL (ref 8.9–10.3)
Creatinine, Ser: 0.88 mg/dL (ref 0.61–1.24)
GFR calc Af Amer: >60 mL/min (ref >60)
GLUCOSE: 151 mg/dL — AB (ref 70–99)
POTASSIUM: 5.4 mmol/L — AB (ref 3.5–5.1)
Sodium: 138 mmol/L (ref 135–145)

## 2017-12-30 LAB — CBC
HCT: 37.6 % — ABNORMAL LOW (ref 39.0–52.0)
HEMOGLOBIN: 12 g/dL — AB (ref 13.0–17.0)
MCH: 30.4 pg (ref 26.0–34.0)
MCHC: 31.9 g/dL (ref 30.0–36.0)
MCV: 95.2 fL (ref 80.0–100.0)
NRBC: 0 % (ref 0.0–0.2)
Platelets: 213 10*3/uL (ref 150–400)
RBC: 3.95 MIL/uL — ABNORMAL LOW (ref 4.22–5.81)
RDW: 13.6 % (ref 11.5–15.5)
WBC: 14 10*3/uL — ABNORMAL HIGH (ref 4.0–10.5)

## 2017-12-30 MED ORDER — POLYETHYLENE GLYCOL 3350 17 G PO PACK: PACK | ORAL | 0 refills | Status: AC

## 2017-12-30 MED ORDER — DOCUSATE SODIUM 100 MG PO CAPS: ORAL_CAPSULE | ORAL | 0 refills | Status: AC

## 2017-12-30 MED ORDER — GABAPENTIN 300 MG PO CAPS: 300.0000 mg | ORAL_CAPSULE | Freq: Every day | ORAL | 1 refills | Status: AC

## 2017-12-30 MED ORDER — FOLIC ACID 1 MG PO TABS: 1.0000 mg | ORAL_TABLET | Freq: Every day | ORAL | 0 refills | Status: AC

## 2017-12-30 MED ORDER — OXYCODONE HCL 5 MG PO TABS: ORAL_TABLET | ORAL | 0 refills | Status: AC

## 2017-12-30 MED ORDER — ASPIRIN 325 MG PO TBEC: DELAYED_RELEASE_TABLET | ORAL | 0 refills | Status: AC

## 2017-12-30 NOTE — Plan of Care (Signed)
  Problem: Education: Goal: Knowledge of General Education information will improve Description: Including pain rating scale, medication(s)/side effects and non-pharmacologic comfort measures Outcome: Progressing   Problem: Health Behavior/Discharge Planning: Goal: Ability to manage health-related needs will improve Outcome: Progressing   Problem: Clinical Measurements: Goal: Respiratory complications will improve Outcome: Progressing   Problem: Activity: Goal: Risk for activity intolerance will decrease Outcome: Progressing   Problem: Nutrition: Goal: Adequate nutrition will be maintained Outcome: Progressing   Problem: Elimination: Goal: Will not experience complications related to urinary retention Outcome: Progressing   Problem: Pain Managment: Goal: General experience of comfort will improve Outcome: Progressing   Problem: Safety: Goal: Ability to remain free from injury will improve Outcome: Progressing   Problem: Skin Integrity: Goal: Risk for impaired skin integrity will decrease Outcome: Progressing   

## 2017-12-30 NOTE — Progress Notes (Signed)
AVS given and reviewed with pt. Medications discussed. All questions answered to satisfaction. Pt escorted off the unit via wheelchair by staff member.  

## 2017-12-30 NOTE — Progress Notes (Signed)
Pt knee incision dsg noted to be moderately saturated with blood. New applied applied. Will continue to closely monitor. Dionne Bucy RN

## 2017-12-30 NOTE — Progress Notes (Signed)
Physical Therapy Treatment Patient Details Name: Cody Diaz MRN: [id number] DOB: October 02, 1955 Today's Date: 12/30/2017    History of Present Illness Pt is a 62 y/o male s/p L TKA. PMH includes HTN.     PT Comments    Patient is making good progress with PT. Positioning/precautions, ambulation schedule, and HEP reviewed.  From a mobility standpoint anticipate patient will be ready for DC home when medically ready.    Follow Up Recommendations  Follow surgeon's recommendation for DC plan and follow-up therapies;Supervision for mobility/OOB     Equipment Recommendations  None recommended by PT    Recommendations for Other Services       Precautions / Restrictions Precautions Precautions: Knee Precaution Comments: Reviewed knee precautions/positioning with pt   Restrictions Weight Bearing Restrictions: Yes LLE Weight Bearing: Weight bearing as tolerated    Mobility  Bed Mobility               General bed mobility comments: pt OOB in chair upon arrival  Transfers                    Ambulation/Gait                 Stairs             Wheelchair Mobility    Modified Rankin (Stroke Patients Only)       Balance Overall balance assessment: Needs assistance Sitting-balance support: No upper extremity supported;Feet supported Sitting balance-Leahy Scale: Good                                      Cognition Arousal/Alertness: Awake/alert Behavior During Therapy: WFL for tasks assessed/performed Overall Cognitive Status: Within Functional Limits for tasks assessed                                        Exercises Total Joint Exercises Ankle Circles/Pumps: AROM;Both Quad Sets: Strengthening;Both;10 reps Short Arc Quad: AROM;Strengthening;Left;10 reps Heel Slides: AROM;Strengthening;Left;10 reps Hip ABduction/ADduction: Strengthening;Left;10 reps Straight Leg Raises: Strengthening;Left;10 reps     General Comments        Pertinent Vitals/Pain Pain Assessment: Faces Faces Pain Scale: Hurts little more Pain Location: L anterior thigh Pain Descriptors / Indicators: Sore;Grimacing Pain Intervention(s): Monitored during session;Repositioned    Home Living                      Prior Function            PT Goals (current goals can now be found in the care plan section) Progress towards PT goals: Progressing toward goals    Frequency    7X/week      PT Plan Current plan remains appropriate    Co-evaluation              AM-PAC PT "6 Clicks" Daily Activity  Outcome Measure  Difficulty turning over in bed (including adjusting bedclothes, sheets and blankets)?: A Little Difficulty moving from lying on back to sitting on the side of the bed? : A Little Difficulty sitting down on and standing up from a chair with arms (e.g., wheelchair, bedside commode, etc,.)?: Unable Help needed moving to and from a bed to chair (including a wheelchair)?: A Little Help needed walking in hospital room?: A Little Help needed climbing 3-5  steps with a railing? : A Little 6 Click Score: 16    End of Session Equipment Utilized During Treatment: Gait belt Activity Tolerance: Patient tolerated treatment well Patient left: in chair;with call bell/phone within reach Nurse Communication: Mobility status PT Visit Diagnosis: Other abnormalities of gait and mobility (R26.89);Pain Pain - Right/Left: Left Pain - part of body: Knee     Time: 1610-9604 PT Time Calculation (min) (ACUTE ONLY): 19 min  Charges:  $Therapeutic Exercise: 8-22 mins                     Erline Levine, PTA Acute Rehabilitation Services Pager: [phone] Office: [phone]     Carolynne Edouard 12/30/2017, 4:40 PM

## 2017-12-30 NOTE — Progress Notes (Signed)
Notified [facility] of DC, faxed note to worker's comp rep Ammie Ferrier at [phone] and spoke w her to notify her that patient is leaving today. Patient has all DME. Patient has card to fill prescriptions through. No other CM needs.

## 2017-12-30 NOTE — Progress Notes (Signed)
Physical Therapy Treatment Patient Details Name: Cody Diaz MRN: [id number] DOB: [birth year] Today's Date: 12/30/2017    History of Present Illness Pt is a 62 y/o male s/p L TKA. PMH includes HTN.     PT Comments    Patient seen for mobility progression. Pt reports more soreness in L thigh this am but with improved weight bearing and gait mechanics. Pt tolerated gait training for 230 ft and able to ascend/descend 8 steps with supervision/min guard assist for safety. Current plan remains appropriate.    Follow Up Recommendations  Follow surgeon's recommendation for DC plan and follow-up therapies;Supervision for mobility/OOB     Equipment Recommendations  None recommended by PT    Recommendations for Other Services       Precautions / Restrictions Precautions Precautions: Knee Precaution Booklet Issued: Yes (comment) Precaution Comments: Reviewed knee precautions/positioning with pt   Restrictions Weight Bearing Restrictions: Yes LLE Weight Bearing: Weight bearing as tolerated    Mobility  Bed Mobility Overal bed mobility: Modified Independent Bed Mobility: Supine to Sit           General bed mobility comments: use of rail and increased time/effort  Transfers Overall transfer level: Needs assistance Equipment used: Rolling walker (2 wheeled) Transfers: Sit to/from Stand Sit to Stand: Min guard         General transfer comment: cues for safe hand placement  Ambulation/Gait Ambulation/Gait assistance: Min guard Gait Distance (Feet): 230 Feet Assistive device: Rolling walker (2 wheeled) Gait Pattern/deviations: Step-through pattern;Decreased stance time - left;Decreased step length - right;Decreased weight shift to left Gait velocity: Decreased   General Gait Details: cues for L heel strike/toe off, step length symmetry, increased stride length, and use of AD for smoother gait pattern; improved step through and weight bearing this  session   Stairs Stairs: Yes Stairs assistance: Supervision;Min guard Stair Management: One rail Left;Step to pattern;Sideways Number of Stairs: 8 General stair comments: carry over of sequencing and technique    Wheelchair Mobility    Modified Rankin (Stroke Patients Only)       Balance Overall balance assessment: Needs assistance Sitting-balance support: No upper extremity supported;Feet supported Sitting balance-Leahy Scale: Good     Standing balance support: Bilateral upper extremity supported;During functional activity Standing balance-Leahy Scale: Poor                              Cognition Arousal/Alertness: Awake/alert Behavior During Therapy: WFL for tasks assessed/performed Overall Cognitive Status: Within Functional Limits for tasks assessed                                        Exercises      General Comments General comments (skin integrity, edema, etc.): dressing dry       Pertinent Vitals/Pain Pain Assessment: 0-10 Pain Score: 4  Pain Location: L anterior thigh Pain Descriptors / Indicators: Sore;Guarding Pain Intervention(s): Monitored during session;Premedicated before session;Repositioned    Home Living                      Prior Function            PT Goals (current goals can now be found in the care plan section) Acute Rehab PT Goals Patient Stated Goal: to go home Progress towards PT goals: Progressing toward goals    Frequency  7X/week      PT Plan Current plan remains appropriate    Co-evaluation              AM-PAC PT "6 Clicks" Daily Activity  Outcome Measure  Difficulty turning over in bed (including adjusting bedclothes, sheets and blankets)?: A Little Difficulty moving from lying on back to sitting on the side of the bed? : A Little Difficulty sitting down on and standing up from a chair with arms (e.g., wheelchair, bedside commode, etc,.)?: Unable Help needed moving  to and from a bed to chair (including a wheelchair)?: A Little Help needed walking in hospital room?: A Little Help needed climbing 3-5 steps with a railing? : A Little 6 Click Score: 16    End of Session Equipment Utilized During Treatment: Gait belt Activity Tolerance: Patient tolerated treatment well Patient left: in chair;with call bell/phone within reach Nurse Communication: Mobility status PT Visit Diagnosis: Other abnormalities of gait and mobility (R26.89);Pain Pain - Right/Left: Left Pain - part of body: Knee     Time: 0821-0845 PT Time Calculation (min) (ACUTE ONLY): 24 min  Charges:  $Gait Training: 23-37 mins                     Erline Levine, PTA Acute Rehabilitation Services Pager: [phone] Office: [phone]     Carolynne Edouard 12/30/2017, 8:53 AM

## 2017-12-30 NOTE — Discharge Summary (Signed)
Patient ID: Cody Diaz MRN: [id number] DOB/AGE: 1955-10-06 62 y.o.  Admit date: 12/28/2017 Discharge date: 12/30/2017  Admission Diagnoses:  Principal Problem:   Post-traumatic osteoarthritis of left knee Active Problems:   Low back pain   Hypertension   Generalized anxiety disorder   Bilateral knee pain   Benign prostatic hyperplasia with urinary frequency   UTI (urinary tract infection)   Discharge Diagnoses:  Same  Past Medical History:  Diagnosis Date  . Anxiety   . GERD (gastroesophageal reflux disease)    VERY RARE  . Hypertension    patient denies, does not take any medications  . PONV (postoperative nausea and vomiting)    WAKES UP QUEASEY (12/28/2017)  . Post-traumatic osteoarthritis of left knee 12/16/2017  . UTI (urinary tract infection)    currently on Cipro 500mg  BID                  Surgeries: Procedure(s): TOTAL KNEE ARTHROPLASTY on 12/28/2017   Consultants:   Discharged Condition: Improved  Hospital Course: Cody Diaz is an 62 y.o. male who was admitted 12/28/2017 for operative treatment ofPost-traumatic osteoarthritis of left knee. Patient has severe unremitting pain that affects sleep, daily activities, and work/hobbies. After pre-op clearance the patient was taken to the operating room on 12/28/2017 and underwent  Procedure(s): TOTAL KNEE ARTHROPLASTY.    Patient was given perioperative antibiotics:  Anti-infectives (From admission, onward)   Start     Dose/Rate Route Frequency Ordered Stop   12/28/17 1330  ceFAZolin (ANCEF) IVPB 2g/100 mL premix     2 g 200 mL/hr over 30 Minutes Intravenous Every 6 hours 12/28/17 1151 12/29/17 1936   12/28/17 0826  tobramycin (NEBCIN) powder  Status:  Discontinued       As needed 12/28/17 0827 12/28/17 0940   12/28/17 0600  ceFAZolin (ANCEF) IVPB 2g/100 mL premix     2 g 200 mL/hr over 30 Minutes Intravenous On call to O.R. 12/28/17 0544 12/28/17 0730       Patient was given sequential  compression devices, early ambulation, and chemoprophylaxis to prevent DVT.  Patient was struggle with pain control and balance the first day.  Post op day two ambulated better.  Going home in the care of his aunt  Patient benefited maximally from hospital stay and there were no complications.    Recent vital signs:  Patient Vitals for the past 24 hrs:  BP Temp Temp src Pulse Resp SpO2  12/30/17 0450 132/84 97.7 F (36.5 C) Oral (!) 54 18 97 %  12/29/17 1953 (!) 146/86 98.2 F (36.8 C) Oral 62 18 98 %  12/29/17 1422 138/69 97.8 F (36.6 C) - 73 18 95 %     Recent laboratory studies:  Recent Labs    12/29/17 0423 12/30/17 0235  WBC 14.9* 14.0*  HGB 13.3 12.0*  HCT 41.9 37.6*  PLT 215 213  NA 137 138  K 4.7 5.4*  CL 104 102  CO2 26 30  BUN 13 16  CREATININE 0.94 0.88  GLUCOSE 165* 151*  CALCIUM 8.7* 8.9     Discharge Medications:   Allergies as of 12/30/2017   No Known Allergies     Medication List    STOP taking these medications   ciprofloxacin 500 MG tablet Commonly known as:  CIPRO     TAKE these medications   aspirin 325 MG EC tablet 1 tab a day for the next 30 days to prevent blood clots   cyclobenzaprine 5 MG  tablet Commonly known as:  FLEXERIL Take 5 mg by mouth daily as needed for spasms.   docusate sodium 100 MG capsule Commonly known as:  COLACE 1 tab 2 times a day while on narcotics.  STOOL SOFTENER   folic acid 1 MG tablet Commonly known as:  FOLVITE Take 1 tablet (1 mg total) by mouth daily. Start taking on:  12/31/2017   gabapentin 300 MG capsule Commonly known as:  NEURONTIN Take 1 capsule (300 mg total) by mouth at bedtime.   oxyCODONE 5 MG immediate release tablet Commonly known as:  Oxy IR/ROXICODONE 1 po q 4 hrs prn pain   polyethylene glycol packet Commonly known as:  MIRALAX / GLYCOLAX 17grams in 6 oz of water twice a day until bowel movement.  LAXITIVE.  Restart if two days since last bowel movement   tamsulosin 0.4 MG  Caps capsule Commonly known as:  FLOMAX Take 2 capsules (0.8 mg total) by mouth daily after supper. What changed:    how much to take  when to take this   VENTOLIN HFA 108 (90 Base) MCG/ACT inhaler Generic drug:  albuterol INHALE 1-2 PUFFS INTO THE LUNGS EVERY 6 (SIX) HOURS AS NEEDED FOR WHEEZING OR SHORTNESS OF BREATH. What changed:  See the new instructions.            Discharge Care Instructions  (From admission, onward)         Start     Ordered   12/30/17 0000  Change dressing    Comments:  DO NOT REMOVE BANDAGE OVER SURGICAL INCISION.  WASH WHOLE LEG INCLUDING OVER THE WATERPROOF BANDAGE WITH SOAP AND WATER EVERY DAY.   12/30/17 1332          Diagnostic Studies: No results found.  Disposition: Discharge disposition: 01-Home or Self Care       Discharge Instructions    CPM   Complete by:  As directed    Continuous passive motion machine (CPM):      Use the CPM from 0 to 90 for 6 hours per day.       You may break it up into 2 or 3 sessions per day.      Use CPM for 2 weeks or until you are told to stop.   Call MD / Call 911   Complete by:  As directed    If you experience chest pain or shortness of breath, CALL 911 and be transported to the hospital emergency room.  If you develope a fever above 101 F, pus (white drainage) or increased drainage or redness at the wound, or calf pain, call your surgeon's office.   Change dressing   Complete by:  As directed    DO NOT REMOVE BANDAGE OVER SURGICAL INCISION.  WASH WHOLE LEG INCLUDING OVER THE WATERPROOF BANDAGE WITH SOAP AND WATER EVERY DAY.   Constipation Prevention   Complete by:  As directed    Drink plenty of fluids.  Prune juice may be helpful.  You may use a stool softener, such as Colace (over the counter) 100 mg twice a day.  Use MiraLax (over the counter) for constipation as needed.   Diet - low sodium heart healthy   Complete by:  As directed    Discharge instructions   Complete by:  As directed     INSTRUCTIONS AFTER JOINT REPLACEMENT   Remove items at home which could result in a fall. This includes throw rugs or furniture in walking pathways ICE to the  affected joint every three hours while awake for 30 minutes at a time, for at least the first 3-5 days, and then as needed for pain and swelling.  Continue to use ice for pain and swelling. You may notice swelling that will progress down to the foot and ankle.  This is normal after surgery.  Elevate your leg when you are not up walking on it.   Continue to use the breathing machine you got in the hospital (incentive spirometer) which will help keep your temperature down.  It is common for your temperature to cycle up and down following surgery, especially at night when you are not up moving around and exerting yourself.  The breathing machine keeps your lungs expanded and your temperature down.   DIET:  As you were doing prior to hospitalization, we recommend a well-balanced diet.  DRESSING / WOUND CARE / SHOWERING  Keep the surgical dressing until follow up.  The dressing is water proof, so you can shower without any extra covering.  IF THE DRESSING FALLS OFF or the wound gets wet inside, change the dressing with sterile gauze.  Please use good hand washing techniques before changing the dressing.  Do not use any lotions or creams on the incision until instructed by your surgeon.    ACTIVITY  Increase activity slowly as tolerated, but follow the weight bearing instructions below.   No driving for 6 weeks or until further direction given by your physician.  You cannot drive while taking narcotics.  No lifting or carrying greater than 10 lbs. until further directed by your surgeon. Avoid periods of inactivity such as sitting longer than an hour when not asleep. This helps prevent blood clots.  You may return to work once you are authorized by your doctor.     WEIGHT BEARING   Weight bearing as tolerated with assist device (walker,  cane, etc) as directed, use it as long as suggested by your surgeon or therapist, typically at least 2-3 weeks.   EXERCISES  Results after joint replacement surgery are often greatly improved when you follow the exercise, range of motion and muscle strengthening exercises prescribed by your doctor. Safety measures are also important to protect the joint from further injury. Any time any of these exercises cause you to have increased pain or swelling, decrease what you are doing until you are comfortable again and then slowly increase them. If you have problems or questions, call your caregiver or physical therapist for advice.   Rehabilitation is important following a joint replacement. After just a few days of immobilization, the muscles of the leg can become weakened and shrink (atrophy).  These exercises are designed to build up the tone and strength of the thigh and leg muscles and to improve motion. Often times heat used for twenty to thirty minutes before working out will loosen up your tissues and help with improving the range of motion but do not use heat for the first two weeks following surgery (sometimes heat can increase post-operative swelling).   These exercises can be done on a training (exercise) mat, on the floor, on a table or on a bed. Use whatever works the best and is most comfortable for you.    Use music or television while you are exercising so that the exercises are a pleasant break in your day. This will make your life better with the exercises acting as a break in your routine that you can look forward to.   Perform all exercises  about fifteen times, three times per day or as directed.  You should exercise both the operative leg and the other leg as well.   Exercises include:  Quad Sets - Tighten up the muscle on the front of the thigh (Quad) and hold for 5-10 seconds.   Straight Leg Raises - With your knee straight (if you were given a brace, keep it on), lift the leg to 60  degrees, hold for 3 seconds, and slowly lower the leg.  Perform this exercise against resistance later as your leg gets stronger.  Leg Slides: Lying on your back, slowly slide your foot toward your buttocks, bending your knee up off the floor (only go as far as is comfortable). Then slowly slide your foot back down until your leg is flat on the floor again.  Angel Wings: Lying on your back spread your legs to the side as far apart as you can without causing discomfort.  Hamstring Strength:  Lying on your back, push your heel against the floor with your leg straight by tightening up the muscles of your buttocks.  Repeat, but this time bend your knee to a comfortable angle, and push your heel against the floor.  You may put a pillow under the heel to make it more comfortable if necessary.   A rehabilitation program following joint replacement surgery can speed recovery and prevent re-injury in the future due to weakened muscles. Contact your doctor or a physical therapist for more information on knee rehabilitation.    CONSTIPATION  Constipation is defined medically as fewer than three stools per week and severe constipation as less than one stool per week.  Even if you have a regular bowel pattern at home, your normal regimen is likely to be disrupted due to multiple reasons following surgery.  Combination of anesthesia, postoperative narcotics, change in appetite and fluid intake all can affect your bowels.   YOU MUST use at least one of the following options; they are listed in order of increasing strength to get the job done.  They are all available over the counter, and you may need to use some, POSSIBLY even all of these options:    Drink plenty of fluids (prune juice may be helpful) and high fiber foods Colace 100 mg by mouth twice a day  Senokot for constipation as directed and as needed Dulcolax (bisacodyl), take with full glass of water  Miralax (polyethylene glycol) once or twice a day as  needed.  If you have tried all these things and are unable to have a bowel movement in the first 3-4 days after surgery call either your surgeon or your primary doctor.    If you experience loose stools or diarrhea, hold the medications until you stool forms back up.  If your symptoms do not get better within 1 week or if they get worse, check with your doctor.  If you experience "the worst abdominal pain ever" or develop nausea or vomiting, please contact the office immediately for further recommendations for treatment.   ITCHING:  If you experience itching with your medications, try taking only a single pain pill, or even half a pain pill at a time.  You can also use Benadryl over the counter for itching or also to help with sleep.   TED HOSE STOCKINGS:  Use stockings on both legs until for at least 2 weeks or as directed by physician office. They may be removed at night for sleeping.  MEDICATIONS:  See your medication summary  on the "After Visit Summary" that nursing will review with you.  You may have some home medications which will be placed on hold until you complete the course of blood thinner medication.  It is important for you to complete the blood thinner medication as prescribed.  PRECAUTIONS:  If you experience chest pain or shortness of breath - call 911 immediately for transfer to the hospital emergency department.   If you develop a fever greater that 101 F, purulent drainage from wound, increased redness or drainage from wound, foul odor from the wound/dressing, or calf pain - CONTACT YOUR SURGEON.                                                   FOLLOW-UP APPOINTMENTS:  If you do not already have a post-op appointment, please call the office for an appointment to be seen by your surgeon.  Guidelines for how soon to be seen are listed in your "After Visit Summary", but are typically between 1-4 weeks after surgery.  OTHER INSTRUCTIONS:   Knee Replacement:  Do not place pillow  under knee, focus on keeping the knee straight while resting. CPM instructions: 0-90 degrees, 2 hours in the morning, 2 hours in the afternoon, and 2 hours in the evening. Place foam block, curve side up under heel at all times except when in CPM or when walking.  DO NOT modify, tear, cut, or change the foam block in any way.  MAKE SURE YOU:  Understand these instructions.  Get help right away if you are not doing well or get worse.    Thank you for letting us be a part of your medical care team.  It is a privilege we respect greatly.  We hope these instructions will help you stay on track for a fast and full recovery!   Do not put a pillow under the knee. Place it under the heel.   Complete by:  As directed    Place gray foam block, curve side up under heel at all times except when in CPM or when walking.  DO NOT modify, tear, cut, or change in any way the gray foam block.   Increase activity slowly as tolerated   Complete by:  As directed    Patient may shower   Complete by:  As directed    Aquacel dressing is water proof    Wash over it and the whole leg with soap and water at the end of your shower   TED hose   Complete by:  As directed    Use stockings (TED hose) for 2 weeks on both leg(s).  You may remove them at night for sleeping.      Follow-up Information    [facility] Follow up.   Specialty:  Home Health Services Why:  Home Health Physical Therapy-agency will call to arrange initial appointment Contact information: [address] [place] [address] [phone]        Salvatore Marvel, MD Follow up on 01/11/2018.   Specialty:  Orthopedic Surgery Why:  PHYSICAL THERAPY AT DR [facility]ide [facility] OFFICE arrive at 1:30 for 2pm appointment then will see Dr Thurston Hole right after physical therapy appointment Contact information: [address] [place] [address] [phone]            Signed: Tonny Bollman  J Paytin Ramakrishnan 12/30/2017, 1:38  PM

## 2017-12-30 NOTE — Progress Notes (Signed)
Previous CM notes state " HH was preoperatively arranged with [facility] from surgeon's office and auth through [employer]. " Upon calling [facility] to verify they were not aware of his case. Faxed all needed forms to them and notified worker's comp case manager to follow up.

## 2018-01-28 MED FILL — TAMSULOSIN HCL 0.4 MG CAP: 0.4 | 30 days supply | Qty: 60 | Fill #1

## 2018-03-03 MED FILL — TAMSULOSIN HCL 0.4 MG CAP: 0.4 | 90 days supply | Qty: 180 | Fill #2

## 2018-05-03 IMAGING — CR DG KNEE COMPLETE 4+V*R*
5 series · 5 of 5 positions shown · non-contrast
Comparison: None.

CLINICAL DATA: Patient had surgery for hairline fracture 3 years
ago. He still has pain and increased immobility for about 3 months,
no new injury.

EXAM:
RIGHT KNEE - COMPLETE 4+ VIEW

[knee ap]
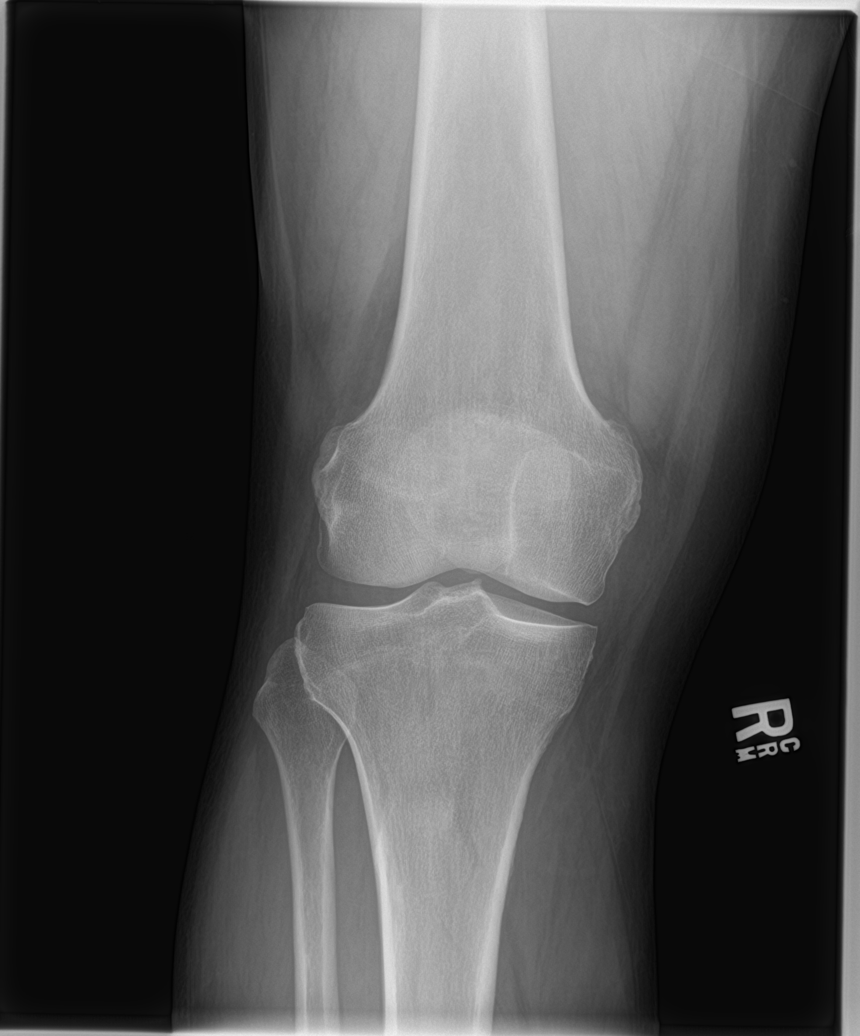

[knee obl (1 of 2)]
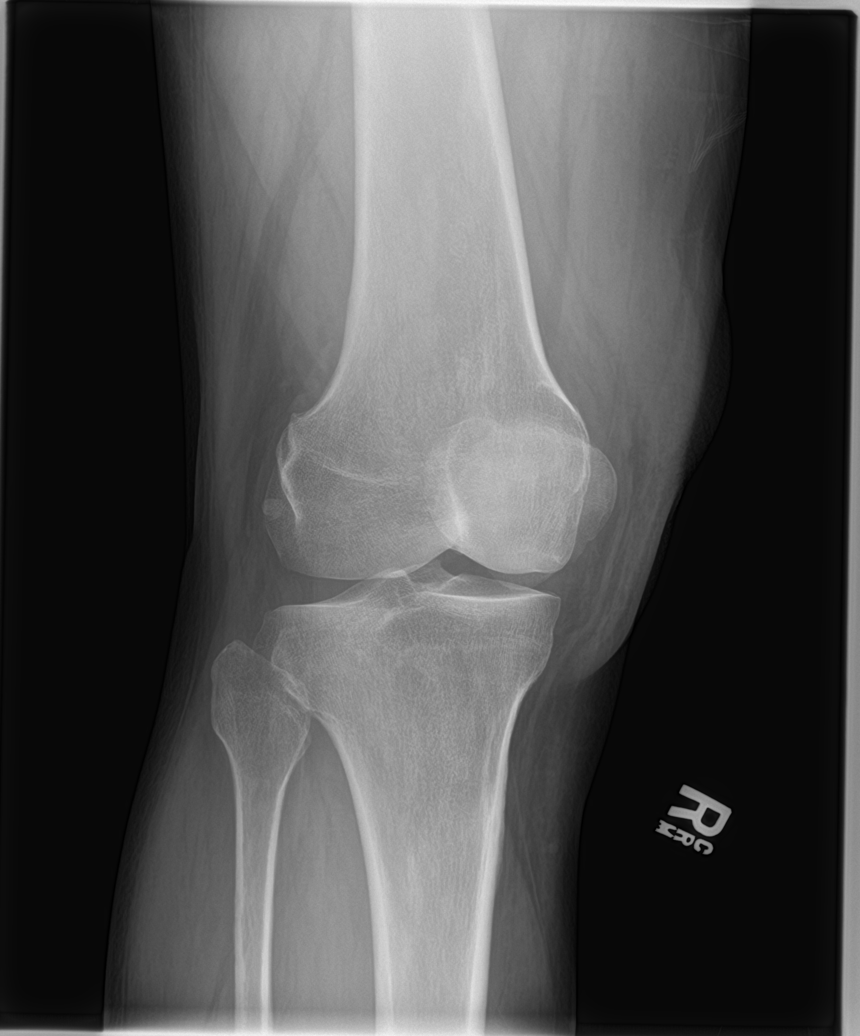

[knee obl (2 of 2)]
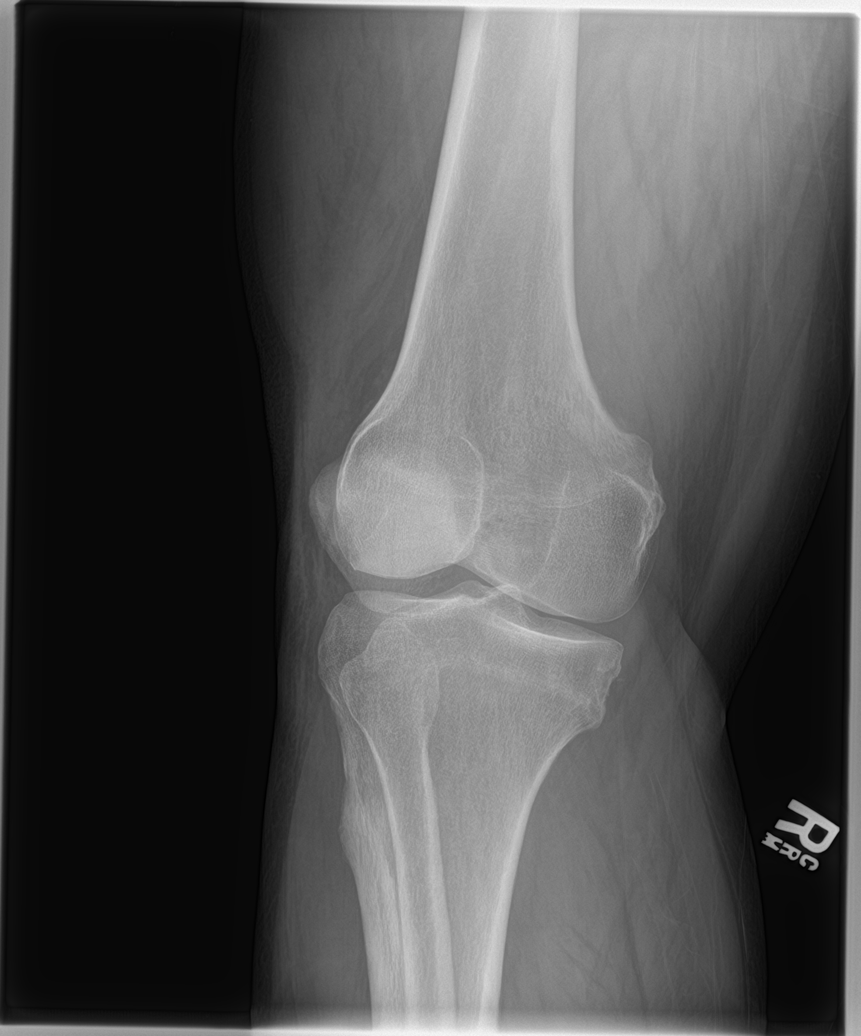

[knee lat]
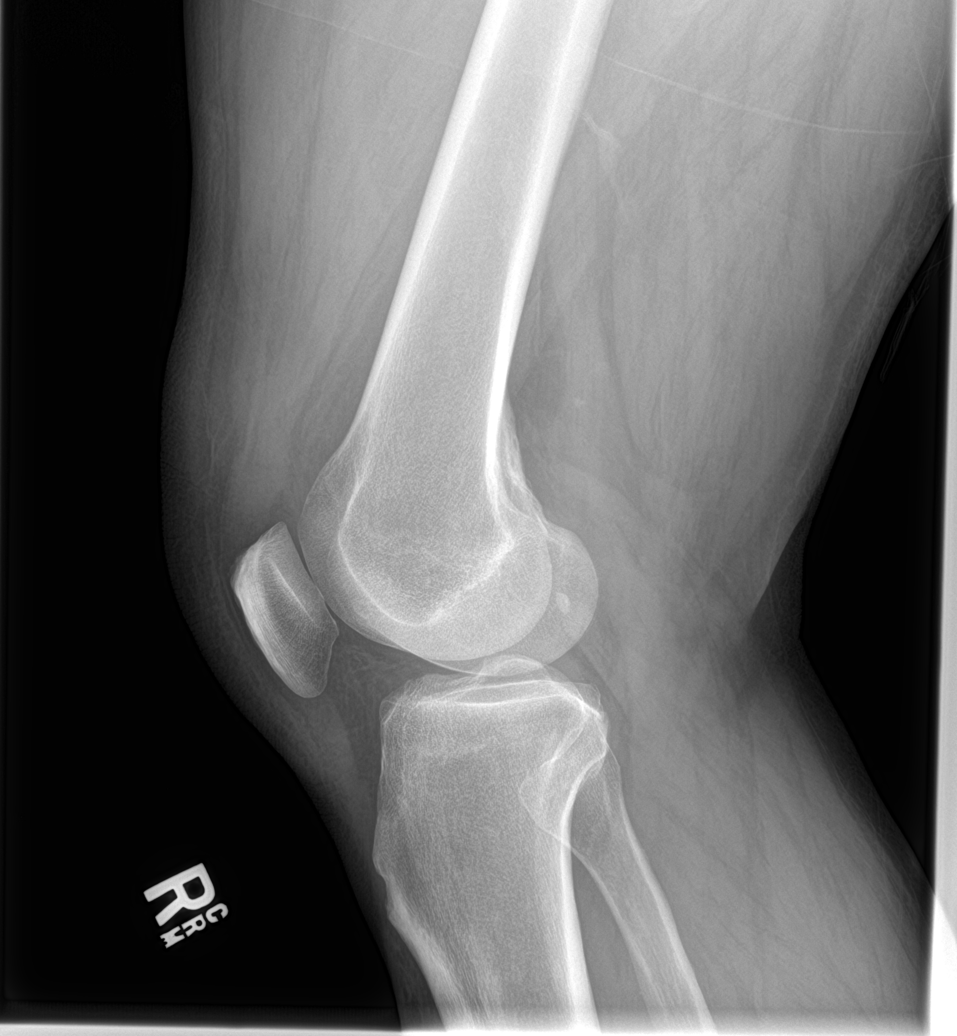

[knee sunrise]
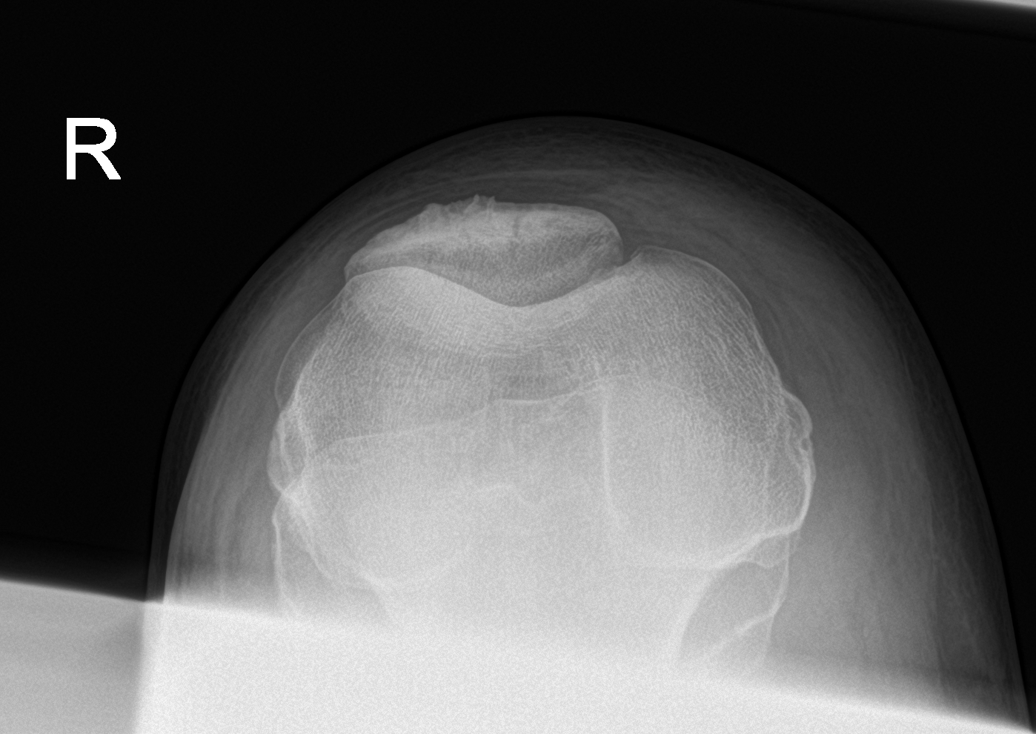

[5 of 5 positions shown; findings below may reference images not displayed]

FINDINGS: No fracture or bone lesion.

Knee joint normally spaced and aligned.  No arthropathic change.

No joint effusion.  The soft tissues are unremarkable.
IMPRESSION: Negative.

## 2021-07-03 LAB — PROSTATE SPECIFIC ANTIGEN, TOTAL: PSA: 4.3 ng/ml — AB (ref 0.0–4.0)

## 2021-07-15 ENCOUNTER — Institutional Professional Consult (permissible substitution): Admit: 2021-07-15 | Discharge: 2021-07-15 | Payer: MEDICARE | Attending: Urology

## 2021-07-15 DIAGNOSIS — N289 Disorder of kidney and ureter, unspecified: Secondary | ICD-10-CM

## 2021-07-15 LAB — AMB POC PVR, MEAS,POST-VOID RES,US,NON-IMAGING

## 2021-07-15 LAB — AMB POC URINALYSIS DIP STICK AUTO W/O MICRO
Bilirubin, Urine, POC: NEGATIVE
Blood, Urine, POC: NEGATIVE
Glucose, Urine, POC: NEGATIVE
Ketones, Urine, POC: NEGATIVE
Leukocyte Esterase, Urine, POC: NEGATIVE
Nitrite, Urine, POC: NEGATIVE
Protein, Urine, POC: NEGATIVE
Specific Gravity, Urine, POC: 1010 — AB (ref 1.001–1.035)
Urobilinogen, POC: 0.2
pH, Urine, POC: 5.5 (ref 4.6–8.0)

## 2021-07-15 NOTE — Progress Notes (Signed)
Chris Hall  [birth year]  Encounter Date: 07/15/2021      ASSESSMENT:     ICD-10-CM    1. Kidney lesion, native, left  N28.9 AMB POC URINALYSIS DIP STICK AUTO W/O MICRO     AMB POC PVR, MEAS,POST-VOID RES,US,NON-IMAGING      2. BPH with obstruction/lower urinary tract symptoms  N40.1     N13.8       3. Elevated PSA  R97.20           -Left renal mass   CT A/P 06/10/21 ([facility]): 18 mm heterogeneously enhancing mass arising exophytically from lower pole left kidney consistent with renal neoplasm. No adenopathy is identified.     -H/o Elevated PSA at 7.2 ng/ml on 07/31/17. No prior biopsy.    Most recent PSA was 4.3 ng/ml on 07/03/21    -BPH with LUTS, Currently on Flomax 0.4 mg and Cialis 5 mg daily.     -Recurrent UTI, most recent UA 07/03/21 was nitrite positive. Neg today      PLAN:    Reviewed ED notes  Reviewed referring notes  Reviewed CT report, films in [facility] are not available.  Discussed ablative therapies.   Discussed CT guided biopsy vs removal. Pt prefers removal, even if CT guided biopsy is negative pt still prefers removal.   Advised pt I cannot schedule surgery prior to seeing his films. Pt will mail in disc, happy to coordinate once I am able to review the films.   Follow up TBA.       DISCUSSION:   Discussed the various treatment options for SRMs of observation, bx, ablation, and partial nephrectomy. Discussed risks and benefits of each    Chief Complaint   Patient presents with    Kidney Mass       HISTORY OF PRESENT ILLNESS:  Chris Hall is a 66 y.o. male who presents today for consultation and has been referred by Dr. Patty Sermons. Patient went to the ED 05/29/21 complaining of left flank pain x10 days, also notes this pain had been going on intermittently for 3 months. CT showed an 18 mm heterogeneously enhancing mass arising exophytically from lower pole left kidney consistent with renal neoplasm. No adenopathy is identified.       Patient is doing well today.     Reports having pain on  his side causing him to get a CT.   Renal lesion was an incidental finding, pain seems to be MSK and persists.   Reports Dr. Patty Sermons advised him to FU here to discuss surgery.     Reports his was initially seen by a urologist in [place].   Was told his Prostate is not enlarged on DRE.   Has been on Flomax 0.4 mg and Cialis 5 mg daily for the last 5 years with benefit     Reports being treated for a UTI 8 years. Was told he needed to stop the abx he was on due to it affecting kidney function.   Reports 5 UTI's that year.   Denies any recent UTI's or symptoms today.     S/p knee replacement.   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     Mother had HTN, liver, kidney and heart failure.   Denies any family history of cancer.      Past Medical History:   Diagnosis Date    Asthma     BPH (benign prostatic hyperplasia)     ED (erectile dysfunction)  Elevated PSA     HTN (hypertension)     Hypercholesteremia     Obesity     Prediabetes          Current Outpatient Medications:     albuterol sulfate HFA (PROVENTIL;VENTOLIN;PROAIR) 108 (90 Base) MCG/ACT inhaler, albuterol sulfate HFA 90 mcg/actuation aerosol inhaler  Inhale 2 puffs every 4 hours by inhalation route as needed., Disp: , Rfl:     ibuprofen (ADVIL;MOTRIN) 200 MG tablet, Take by mouth every 4 hours as needed, Disp: , Rfl:     lisinopril (PRINIVIL;ZESTRIL) 10 MG tablet, lisinopril 10 mg tablet  TAKE 1 TABLET BY MOUTH EVERY DAY FOR 30 DAYS, Disp: , Rfl:     metFORMIN (GLUCOPHAGE) 500 MG tablet, Take 1 tablet by mouth 2 times daily (with meals), Disp: , Rfl:     tamsulosin (FLOMAX) 0.4 MG capsule, tamsulosin 0.4 mg capsule  TAKE 2 CAPSULES BY MOUTH EVERY DAY FOR PROSTATE AND BLADDER, Disp: , Rfl:     History reviewed. No pertinent surgical history.    Social History     Tobacco Use    Smoking status: Never   Substance Use Topics    Alcohol use: Yes    Drug use: Defer       No Known Allergies    No family history on  file.    Current Outpatient Medications   Medication Sig Dispense Refill    albuterol sulfate HFA (PROVENTIL;VENTOLIN;PROAIR) 108 (90 Base) MCG/ACT inhaler albuterol sulfate HFA 90 mcg/actuation aerosol inhaler   Inhale 2 puffs every 4 hours by inhalation route as needed.      ibuprofen (ADVIL;MOTRIN) 200 MG tablet Take by mouth every 4 hours as needed      lisinopril (PRINIVIL;ZESTRIL) 10 MG tablet lisinopril 10 mg tablet   TAKE 1 TABLET BY MOUTH EVERY DAY FOR 30 DAYS      metFORMIN (GLUCOPHAGE) 500 MG tablet Take 1 tablet by mouth 2 times daily (with meals)      tamsulosin (FLOMAX) 0.4 MG capsule tamsulosin 0.4 mg capsule   TAKE 2 CAPSULES BY MOUTH EVERY DAY FOR PROSTATE AND BLADDER       No current facility-administered medications for this visit.         PHYSICAL EXAMINATION:   Ht 5\' 8"  (1.727 m)   Wt 246 lb (111.6 kg)   BMI 37.40 kg/m   Constitutional: WDWN, Pleasant and appropriate affect, No acute distress.    CV:  No peripheral swelling noted  Respiratory: No respiratory distress or difficulties  Skin: No jaundice.    Neuro/Psych:  Alert and oriented x 3, affect appropriate.       REVIEW OF LABS AND IMAGING:    Results for orders placed or performed in visit on 07/15/21   AMB POC URINALYSIS DIP STICK AUTO W/O MICRO   Result Value Ref Range    Color, Urine, POC      Clarity, Urine, POC      Glucose, Urine, POC Negative Negative    Bilirubin, Urine, POC Negative Negative    Ketones, Urine, POC Negative Negative    Specific Gravity, Urine, POC 1,010 (A) 1.001 - 1.035    Blood, Urine, POC Negative Negative    pH, Urine, POC 5.5 4.6 - 8.0    Protein, Urine, POC Negative Negative    Urobilinogen, POC 0.2 mg/dL     Nitrite, Urine, POC Negative Negative    Leukocyte Esterase, Urine, POC Negative Negative   AMB POC PVR, MEAS,POST-VOID RES,US,NON-IMAGING  Result Value Ref Range    PVR, POC 59ml cc       CT Abd Pelv W WO Cont. 06/10/2021  IMPRESSION:   1. 18 mm heterogeneously enhancing mass arising exophytically  from lower pole left kidney consistent with renal neoplasm. No adenopathy is identified.   2. There is colonic diverticulosis without diverticulitis.   3. Fatty left inguinal and umbilical base hernia is without inflammation. Equivocal small right fatty inguinal hernia      Molly Maduro Luda Charbonneau MD   Urologic Oncologist  [facility] MD Professorship  Professor of Urology  [facility]  Office [phone] Ext [phone]    CC:No primary care provider on file.    Medical documentation is provided with the assistance of Hennie Duos, medical scribe for Loney Laurence, MD on 07/15/2021

## 2021-07-24 NOTE — Telephone Encounter (Signed)
Patient called and left a voicemail inquiring if we had received his imaging disc, he states it was mailed to our office. Will send a message to the clinical staff to see if disc has been received.

## 2021-07-25 NOTE — Telephone Encounter (Signed)
Called patients PCP office, left a voicemail requesting a call back to coordinate clearance appointment prior to surgery.    Called patient to discuss scheduling surgery,  no answer. Left a voicemail requesting a call back to discuss surgery dates.

## 2021-07-25 NOTE — Telephone Encounter (Signed)
Patients PCP office called back and advised no appointment needed. Will await patients return call to discuss next avail surgery dates.

## 2021-07-29 NOTE — Telephone Encounter (Signed)
Called patient and discussed the following regarding upcoming surgery:    -Date, check in and start time, location  -Surgery prep and pre-op testing included needed prior.  -Pre & post-op appointment schedule    Adv patient of my direction extension and to c/b with any questions or concerns.

## 2021-08-12 NOTE — Telephone Encounter (Signed)
Patient called and stated that he is scheduled for surgery on 7/26 but does not know any details. Would like to speak with you.    CBN: [id number]

## 2021-08-13 NOTE — Telephone Encounter (Signed)
Spoke with patient, will mail sx letter. Pre-op testing at [facility]. Patient has my direct extension for any questions.

## 2021-09-10 NOTE — Telephone Encounter (Signed)
Advised patient ERAS bag provided in office not at hospital, will provide tomorrow. Cleared up all questions. Patient stated his understanding.

## 2021-09-10 NOTE — Telephone Encounter (Signed)
Pt called with questions about his upcomming surgery.  He is anxious and when he went yesterday to the hosp for some pre-op testing and he asked about his pre-op kit and they told him they dont know anything about that and he wants to know if the person taking him needs to stay the entire time. Can you please call him and talk to him.  I did confirm his appt for tomorrow.  Thanks

## 2021-09-11 ENCOUNTER — Ambulatory Visit: Admit: 2021-09-11 | Payer: MEDICARE | Attending: Urology

## 2021-09-11 DIAGNOSIS — N289 Disorder of kidney and ureter, unspecified: Secondary | ICD-10-CM

## 2021-09-11 NOTE — Progress Notes (Unsigned)
Chris Hall  [birth year]  Encounter Date: 09/11/2021      ASSESSMENT:   No diagnosis found.      -Left renal mass   CT A/P 06/10/21 [facility]): 18 mm heterogeneously enhancing mass arising exophytically from lower pole left kidney consistent with renal neoplasm. No adenopathy is identified.     -H/o Elevated PSA at 7.2 ng/ml on 07/31/17. No prior biopsy.    Most recent PSA was 4.3 ng/ml on 07/03/21    -BPH with LUTS, Currently on Flomax 0.4 mg and Cialis 5 mg daily.     -Recurrent UTI, most recent UA 07/03/21 was nitrite positive. Neg today      PLAN:          To OR 09/18/21    ***  Reviewed ED notes  Reviewed referring notes  Reviewed CT report, films in [facility] are not available.  Discussed ablative therapies.   Discussed CT guided biopsy vs removal. Pt prefers removal, even if CT guided biopsy is negative pt still prefers removal.   Advised pt I cannot schedule surgery prior to seeing his films. Pt will mail in disc, happy to coordinate once I am able to review the films.   Follow up TBA.       DISCUSSION:   Discussed the various treatment options for SRMs of observation, bx, ablation, and partial nephrectomy. Discussed risks and benefits of each    ADDENDUM: 07/19/21  Reviewed CT. Amendable to lap partial nephrectomy. Will arrange.    No chief complaint on file.      HISTORY OF PRESENT ILLNESS:  Chris Hall is a 66 y.o. male who presents today in follow up for a renal mass and has been referred by Dr. Patty Sermons. Patient went to the ED 05/29/21 complaining of left flank pain x10 days, also notes this pain had been going on intermittently for 3 months. CT showed an 18 mm heterogeneously enhancing mass arising exophytically from lower pole left kidney consistent with renal neoplasm. No adenopathy is identified.               ***  Patient is doing well today.     Reports having pain on his side causing him to get a CT.   Renal lesion was an incidental finding, pain seems to be MSK and persists.   Reports Dr.  Patty Sermons advised him to FU here to discuss surgery.     Reports his was initially seen by a urologist in [place].   Was told his Prostate is not enlarged on DRE.   Has been on Flomax 0.4 mg and Cialis 5 mg daily for the last 5 years with benefit     Reports being treated for a UTI 8 years. Was told he needed to stop the abx he was on due to it affecting kidney function.   Reports 5 UTI's that year.   Denies any recent UTI's or symptoms today.     S/p knee replacement.   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     Mother had HTN, liver, kidney and heart failure.   Denies any family history of cancer.      Past Medical History:   Diagnosis Date    Asthma     BPH (benign prostatic hyperplasia)     ED (erectile dysfunction)     Elevated PSA     HTN (hypertension)     Hypercholesteremia     Obesity     Prediabetes  Current Outpatient Medications:     albuterol sulfate HFA (PROVENTIL;VENTOLIN;PROAIR) 108 (90 Base) MCG/ACT inhaler, albuterol sulfate HFA 90 mcg/actuation aerosol inhaler  Inhale 2 puffs every 4 hours by inhalation route as needed., Disp: , Rfl:     ibuprofen (ADVIL;MOTRIN) 200 MG tablet, Take by mouth every 4 hours as needed, Disp: , Rfl:     lisinopril (PRINIVIL;ZESTRIL) 10 MG tablet, lisinopril 10 mg tablet  TAKE 1 TABLET BY MOUTH EVERY DAY FOR 30 DAYS, Disp: , Rfl:     metFORMIN (GLUCOPHAGE) 500 MG tablet, Take 1 tablet by mouth 2 times daily (with meals), Disp: , Rfl:     tamsulosin (FLOMAX) 0.4 MG capsule, tamsulosin 0.4 mg capsule  TAKE 2 CAPSULES BY MOUTH EVERY DAY FOR PROSTATE AND BLADDER, Disp: , Rfl:     No past surgical history on file.    Social History     Tobacco Use    Smoking status: Never   Substance Use Topics    Alcohol use: Yes    Drug use: Defer       No Known Allergies    No family history on file.    Current Outpatient Medications   Medication Sig Dispense Refill    albuterol sulfate HFA (PROVENTIL;VENTOLIN;PROAIR) 108 (90 Base) MCG/ACT inhaler  albuterol sulfate HFA 90 mcg/actuation aerosol inhaler   Inhale 2 puffs every 4 hours by inhalation route as needed.      ibuprofen (ADVIL;MOTRIN) 200 MG tablet Take by mouth every 4 hours as needed      lisinopril (PRINIVIL;ZESTRIL) 10 MG tablet lisinopril 10 mg tablet   TAKE 1 TABLET BY MOUTH EVERY DAY FOR 30 DAYS      metFORMIN (GLUCOPHAGE) 500 MG tablet Take 1 tablet by mouth 2 times daily (with meals)      tamsulosin (FLOMAX) 0.4 MG capsule tamsulosin 0.4 mg capsule   TAKE 2 CAPSULES BY MOUTH EVERY DAY FOR PROSTATE AND BLADDER       No current facility-administered medications for this visit.         PHYSICAL EXAMINATION:   There were no vitals taken for this visit.  Constitutional: WDWN, Pleasant and appropriate affect, No acute distress.    CV:  No peripheral swelling noted  Respiratory: No respiratory distress or difficulties  Skin: No jaundice.    Neuro/Psych:  Alert and oriented x 3, affect appropriate.       REVIEW OF LABS AND IMAGING:    No results found for this visit on 09/11/21.      CT Abd Pelv W WO Cont. 06/10/2021  IMPRESSION:   1. 18 mm heterogeneously enhancing mass arising exophytically from lower pole left kidney consistent with renal neoplasm. No adenopathy is identified.   2. There is colonic diverticulosis without diverticulitis.   3. Fatty left inguinal and umbilical base hernia is without inflammation. Equivocal small right fatty inguinal hernia      Molly Maduro Given MD   Urologic Oncologist  [facility] MD Professorship  Professor of Urology  [employer]  Office [phone] Ext [phone]    CC:No primary care provider on file.    Medical documentation is provided with the assistance of Hennie Duos, medical scribe for Hennie Duos, [place] on 09/11/2021

## 2021-09-13 ENCOUNTER — Encounter: Attending: Urology

## 2021-09-15 LAB — CULTURE, URINE

## 2021-09-16 MED ORDER — AMOXICILLIN-POT CLAVULANATE 875-125 MG PO TABS
875-125 MG | ORAL_TABLET | Freq: Two times a day (BID) | ORAL | 0 refills | Status: AC
Start: 2021-09-16 — End: 2021-09-23

## 2021-09-16 NOTE — Telephone Encounter (Signed)
Called patient notified to pick up and start abx ASAP to proceed with surgery Wednesday.

## 2021-10-02 ENCOUNTER — Ambulatory Visit: Attending: Physician Assistant

## 2021-10-02 NOTE — Progress Notes (Signed)
Chris Hall  Apr 21, 1955  Encounter Date: 10/02/2021    Appt for 10/02/21 was rescheduled to 10/15/21.  Below is summary of pt's history        ASSESSMENT:     ICD-10-CM    1. Renal cell carcinoma of left kidney (HCC)  C64.2               -pT1aNxM0R0 clear cell RCC FG 2 s/p Robot-assisted left partial nephrectomy on 09/18/21    -H/o Elevated PSA at 7.2 ng/ml on 07/31/17. No prior biopsy.    Most recent PSA was 4.3 ng/ml on 07/03/21    -BPH with LUTS, Currently on Flomax 0.4 mg and Cialis 5 mg daily.     -Recurrent UTI, most recent UA 07/03/21 was nitrite positive.      Plan:  Pathology reviewed, copy provided No additional treatment needed  Continue physical restrictions for 4-6 weeks post-op  Hydrate  Avoid nephrotoxins  Follow up 6 mos with Dr Given with CT and CXR 1 week prior    Chief Complaint   Patient presents with    Pre-op Exam       HISTORY OF PRESENT ILLNESS:  Chris Hall is a 66 y.o. male with h/o left renal cell carcinoma s/p Robot-assisted left partial nephrectomy on 09/18/21.  Pathology: T1aNxM0R0 ccRCC FG2.    Prior history:  Reports his was initially seen by a urologist in [place].   Was told his prostate is not enlarged on DRE.   Has been on Flomax 0.4 mg and Cialis 5 mg daily for the last 5 years with benefit      S/p knee replacement.     Mother had HTN, liver, kidney and heart failure.      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    HTN (hypertension)     Hypercholesteremia     Obesity     Prediabetes          Current Outpatient Medications:     albuterol sulfate HFA (PROVENTIL;VENTOLIN;PROAIR) 108 (90 Base) MCG/ACT inhaler, albuterol sulfate HFA 90 mcg/actuation aerosol inhaler  Inhale 2 puffs every 4 hours by inhalation route as needed., Disp: , Rfl:     ibuprofen (ADVIL;MOTRIN) 200 MG tablet, Take by mouth every 4 hours as needed, Disp: , Rfl:     lisinopril  (PRINIVIL;ZESTRIL) 10 MG tablet, lisinopril 10 mg tablet  TAKE 1 TABLET BY MOUTH EVERY DAY FOR 30 DAYS, Disp: , Rfl:     metFORMIN (GLUCOPHAGE) 500 MG tablet, Take 1 tablet by mouth 2 times daily (with meals), Disp: , Rfl:     tamsulosin (FLOMAX) 0.4 MG capsule, tamsulosin 0.4 mg capsule  TAKE 2 CAPSULES BY MOUTH EVERY DAY FOR PROSTATE AND BLADDER, Disp: , Rfl:     No past surgical history on file.    Social History     Tobacco Use    Smoking status: Never   Substance Use Topics    Alcohol use: Yes    Drug use: Defer       No Known Allergies    No family history on file.    Current Outpatient Medications   Medication Sig Dispense Refill    albuterol sulfate HFA (PROVENTIL;VENTOLIN;PROAIR) 108 (90 Base) MCG/ACT inhaler albuterol sulfate HFA 90 mcg/actuation aerosol inhaler  Inhale 2 puffs every 4 hours by inhalation route as needed.      ibuprofen (ADVIL;MOTRIN) 200 MG tablet Take by mouth every 4 hours as needed      lisinopril (PRINIVIL;ZESTRIL) 10 MG tablet lisinopril 10 mg tablet   TAKE 1 TABLET BY MOUTH EVERY DAY FOR 30 DAYS      metFORMIN (GLUCOPHAGE) 500 MG tablet Take 1 tablet by mouth 2 times daily (with meals)      tamsulosin (FLOMAX) 0.4 MG capsule tamsulosin 0.4 mg capsule   TAKE 2 CAPSULES BY MOUTH EVERY DAY FOR PROSTATE AND BLADDER       No current facility-administered medications for this visit.         PHYSICAL EXAMINATION:   There were no vitals taken for this visit.  Not seen in clinic today. Appt rescheduled.      REVIEW OF LABS AND IMAGING:    No results found for this visit on 10/02/21.    Left partial nephrectomy path 09/18/21:    SPECIMEN    - PROCEDURE: PARTIAL NEPHRECTOMY    - SPECIMEN LATERALITY: LEFT     TUMOR    - TUMOR FOCALITY: UNIFOCAL    - TUMOR SITE: LOWER POLE POSTERIOR (FROM OPERATIVE NOTE)    - TUMOR SIZE: GREATEST DIMENSION: 1.8 cm  - ADDITIONAL DIMENSION: 1.7 x 1.7 cm    - HISTOLOGIC TYPE: CLEAR CELL    - HISTOLOGIC GRADE (WHO / ISUP): G2    - TUMOR EXTENT: CONFINED TO KIDNEY     - SARCOMATOID FEATURES: NOT IDENTIFIED    - RHABDOID FEATURES: NOT IDENTIFIED    - TUMOR NECROSIS: NOT IDENTIFIED    - LYMPHOVASCULAR INVASION: NOT IDENTIFIED     MARGINS    - MARGIN STATUS: ALL MARGINS NEGATIVE FOR INVASIVE CARCINOMA. SEE COMMENT.     REGIONAL LYMPH NODES    - REGIONAL LYMPH NODE STATUS: NOT APPLICABLE (NO REGIONAL LYMPH NODES SUBMITTED NOR FOUND)  DISTANT METASTASIS    - DISTANT SITE(S) INVOLVED: NOT APPLICABLE     PATHOLOGIC STAGE CLASSIFICATION (pTNM, AJCC 8TH EDITION)    - TNM DESCRIPTORS: NOT APPLICABLE    - PRIMARY TUMOR: pT1a    - REGIONAL LYMPH NODES: pN NOT ASSIGNED (NO NODES SUBMITTED OR FOUND)    - DISTANT METASTASIS: NOT APPLICABLE     ADDITIONAL FINDINGS    - ADDITIONAL FINDINGS IN NON-NEOPLASTIC KIDNEY: UNREMARKABLE GLOMERULI    - ADDITIONAL FINDINGS: NONE     COMMENT:  ONE OF THE SECTIONS, A3, SHOWS AN INCOMPLETE CAPSULE.  THERE IS NO INK IN THIS AREA AND MULTIPLE LEVELS ARE STUDIED IN AN ATTEMPT TO COMPLETE THE CAPSULE.  GIVEN THE ABSENCE OF INKED TUMOR, THIS IS PRESUMED TO BE AN ARTIFACT OF SECTIONING.  THE GROSS DESCRIPTION ALSO INDICATES A COMPLETE CAPSULE.

## 2021-10-15 ENCOUNTER — Ambulatory Visit: Admit: 2021-10-15 | Discharge: 2021-10-15 | Payer: MEDICARE | Attending: Physician Assistant

## 2021-10-15 DIAGNOSIS — C642 Malignant neoplasm of left kidney, except renal pelvis: Secondary | ICD-10-CM

## 2021-10-15 LAB — AMB POC URINALYSIS DIP STICK AUTO W/O MICRO
Bilirubin, Urine, POC: NEGATIVE
Blood, Urine, POC: NEGATIVE
Glucose, Urine, POC: NEGATIVE
Ketones, Urine, POC: NEGATIVE
Nitrite, Urine, POC: POSITIVE
Protein, Urine, POC: NEGATIVE
Specific Gravity, Urine, POC: 1.02 (ref 1.001–1.035)
Urobilinogen, POC: 0.2
pH, Urine, POC: 6 (ref 4.6–8.0)

## 2021-10-15 MED ORDER — LEVOFLOXACIN 500 MG PO TABS
500 MG | ORAL_TABLET | Freq: Every day | ORAL | 0 refills | Status: AC
Start: 2021-10-15 — End: 2021-10-22

## 2021-10-15 NOTE — Progress Notes (Signed)
Chris Hall  19-Mar-1955  Encounter Date: 10/15/2021    Dr. Marthann Schiller is my supervising physician for today.          ASSESSMENT:     ICD-10-CM    1. Renal cell carcinoma of left kidney (HCC)  C64.2 AMB POC URINALYSIS DIP STICK AUTO W/O MICRO      2. Recurrent UTI  N39.0 Culture, Urine              -pT1aNxM0R0 clear cell RCC FG 2 s/p Robot-assisted left partial nephrectomy on 09/18/21    -H/o Elevated PSA at 7.2 ng/ml on 07/31/17. No prior biopsy.    Most recent PSA was 4.3 ng/ml on 07/03/21    -BPH with LUTS, Currently on Flomax 0.4 mg and Cialis 5 mg daily.     -Recurrent UTI, most recently in 08/2021      Plan:  Pathology reviewed. No additional treatment needed  Continue physical restrictions for 4-6 weeks post-op  Avoid nephrotoxins  Urine sent for culture, empiric treatment with Levaquin based on previous culture in July  Follow up 6 mos with Dr Given with CT and CXR 1 week prior at the [facility] Complaint   Patient presents with    Other     Post-op for kidney cancer       HISTORY OF PRESENT ILLNESS:  Chris Hall is a 66 y.o. male with h/o left renal cell carcinoma s/p Robot-assisted left partial nephrectomy on 09/18/21.  Pathology: T1aNxM0R0 ccRCC FG2. Doing well since surgery.  He feels a "twinge" at the drain site, but otherwise feels good.  He also reports slight burn with urination.  He has had several UTIs most recently in July 2023 right before his surgery.  Denies hematuria, chronic cough, chest pain, SOB, LE edema or pain.     Reports sensation of incomplete emptying. He continues on Flomax and Cialis. Also drinks tea daily.     Prior history:  Reports his was initially seen by a urologist in [place].   Was told his prostate is not enlarged on DRE.   Has been on Flomax 0.4 mg and Cialis 5 mg daily for the last 5 years with benefit      S/p knee replacement.     Mother had HTN, liver, kidney and heart failure.      Past Medical History:   Diagnosis Date    Asthma     BPH  (benign prostatic hyperplasia)     ED (erectile dysfunction)     Elevated PSA     Giant tear of retina 08/2021    HTN (hypertension)     Hypercholesteremia     Obesity     Prediabetes          Current Outpatient Medications:     levoFLOXacin (LEVAQUIN) 500 MG tablet, Take 1 tablet by mouth daily for 7 days, Disp: 7 tablet, Rfl: 0    albuterol sulfate HFA (PROVENTIL;VENTOLIN;PROAIR) 108 (90 Base) MCG/ACT inhaler, albuterol sulfate HFA 90 mcg/actuation aerosol inhaler  Inhale 2 puffs every 4 hours by inhalation route as needed., Disp: , Rfl:     ibuprofen (ADVIL;MOTRIN) 200 MG tablet, Take by mouth every 4 hours as needed, Disp: , Rfl:     lisinopril (PRINIVIL;ZESTRIL) 10 MG tablet, lisinopril 10 mg tablet  TAKE 1 TABLET BY MOUTH EVERY DAY FOR 30 DAYS, Disp: , Rfl:     metFORMIN (GLUCOPHAGE) 500 MG tablet, Take 1 tablet by mouth 2  times daily (with meals), Disp: , Rfl:     tamsulosin (FLOMAX) 0.4 MG capsule, tamsulosin 0.4 mg capsule  TAKE 2 CAPSULES BY MOUTH EVERY DAY FOR PROSTATE AND BLADDER, Disp: , Rfl:     History reviewed. No pertinent surgical history.    Social History     Tobacco Use    Smoking status: Never   Substance Use Topics    Alcohol use: Yes    Drug use: Defer       No Known Allergies    History reviewed. No pertinent family history.    Current Outpatient Medications   Medication Sig Dispense Refill    levoFLOXacin (LEVAQUIN) 500 MG tablet Take 1 tablet by mouth daily for 7 days 7 tablet 0    albuterol sulfate HFA (PROVENTIL;VENTOLIN;PROAIR) 108 (90 Base) MCG/ACT inhaler albuterol sulfate HFA 90 mcg/actuation aerosol inhaler   Inhale 2 puffs every 4 hours by inhalation route as needed.      ibuprofen (ADVIL;MOTRIN) 200 MG tablet Take by mouth every 4 hours as needed      lisinopril (PRINIVIL;ZESTRIL) 10 MG tablet lisinopril 10 mg tablet   TAKE 1 TABLET BY MOUTH EVERY DAY FOR 30 DAYS      metFORMIN (GLUCOPHAGE) 500 MG tablet Take 1 tablet by mouth 2 times daily (with meals)      tamsulosin (FLOMAX)  0.4 MG capsule tamsulosin 0.4 mg capsule   TAKE 2 CAPSULES BY MOUTH EVERY DAY FOR PROSTATE AND BLADDER       No current facility-administered medications for this visit.         PHYSICAL EXAMINATION:   Wt 238 lb (108 kg)   BMI 36.19 kg/m   Gen: WNWD, NAD  Resp: Nonlabored  CV: No peripheral edema  Abd: Incisions healed, no erythema or discharge  Skin: No jaundice  Neuro: A&Ox3      REVIEW OF LABS AND IMAGING:    Results for orders placed or performed in visit on 10/15/21   AMB POC URINALYSIS DIP STICK AUTO W/O MICRO   Result Value Ref Range    Color, Urine, POC      Clarity, Urine, POC      Glucose, Urine, POC Negative Negative    Bilirubin, Urine, POC Negative Negative    Ketones, Urine, POC Negative Negative    Specific Gravity, Urine, POC 1.020 1.001 - 1.035    Blood, Urine, POC Negative Negative    pH, Urine, POC 6.0 4.6 - 8.0    Protein, Urine, POC Negative Negative    Urobilinogen, POC 0.2 mg/dL     Nitrite, Urine, POC Positive Negative    Leukocyte Esterase, Urine, POC 1+ Negative       Left partial nephrectomy path 09/18/21:    SPECIMEN    - PROCEDURE: PARTIAL NEPHRECTOMY    - SPECIMEN LATERALITY: LEFT     TUMOR    - TUMOR FOCALITY: UNIFOCAL    - TUMOR SITE: LOWER POLE POSTERIOR (FROM OPERATIVE NOTE)    - TUMOR SIZE: GREATEST DIMENSION: 1.8 cm  - ADDITIONAL DIMENSION: 1.7 x 1.7 cm    - HISTOLOGIC TYPE: CLEAR CELL    - HISTOLOGIC GRADE (WHO / ISUP): G2    - TUMOR EXTENT: CONFINED TO KIDNEY    - SARCOMATOID FEATURES: NOT IDENTIFIED    - RHABDOID FEATURES: NOT IDENTIFIED    - TUMOR NECROSIS: NOT IDENTIFIED    - LYMPHOVASCULAR INVASION: NOT IDENTIFIED     MARGINS    - MARGIN STATUS: ALL MARGINS  NEGATIVE FOR INVASIVE CARCINOMA. SEE COMMENT.     REGIONAL LYMPH NODES    - REGIONAL LYMPH NODE STATUS: NOT APPLICABLE (NO REGIONAL LYMPH NODES SUBMITTED NOR FOUND)  DISTANT METASTASIS    - DISTANT SITE(S) INVOLVED: NOT APPLICABLE     PATHOLOGIC STAGE CLASSIFICATION (pTNM, AJCC 8TH EDITION)    - TNM DESCRIPTORS: NOT  APPLICABLE    - PRIMARY TUMOR: pT1a    - REGIONAL LYMPH NODES: pN NOT ASSIGNED (NO NODES SUBMITTED OR FOUND)    - DISTANT METASTASIS: NOT APPLICABLE     ADDITIONAL FINDINGS    - ADDITIONAL FINDINGS IN NON-NEOPLASTIC KIDNEY: UNREMARKABLE GLOMERULI    - ADDITIONAL FINDINGS: NONE     COMMENT:  ONE OF THE SECTIONS, A3, SHOWS AN INCOMPLETE CAPSULE.  THERE IS NO INK IN THIS AREA AND MULTIPLE LEVELS ARE STUDIED IN AN ATTEMPT TO COMPLETE THE CAPSULE.  GIVEN THE ABSENCE OF INKED TUMOR, THIS IS PRESUMED TO BE AN ARTIFACT OF SECTIONING.  THE GROSS DESCRIPTION ALSO INDICATES A COMPLETE CAPSULE.

## 2021-10-18 LAB — CULTURE, URINE

## 2021-10-18 NOTE — Other (Signed)
He should continue Levaquin daily for 7 days.

## 2021-10-18 NOTE — Telephone Encounter (Addendum)
Called pt to inform of message below no answer left vm     ----- Message from Thresa Ross, [place] sent at 10/18/2021  3:04 PM EDT -----  He should continue Levaquin daily for 7 days.

## 2021-12-27 ENCOUNTER — Encounter: Attending: Urology

## 2021-12-31 ENCOUNTER — Encounter

## 2022-03-11 NOTE — Telephone Encounter (Signed)
Formatting of this note might be different from the original.  Patient last OV from 12-30-21 reviewed, next OV 04/02/22, medication evaluation for continued therapy will be addressed at that OV, medication request approved per protocol.     Continue lisinopril 10 mg take one tab by mouth daily #30 no refill  Electronically signed by Tonny Branch, RN at 03/11/2022 11:19 AM EST

## 2022-04-15 ENCOUNTER — Encounter: Attending: Physician Assistant

## 2022-05-14 NOTE — Progress Notes (Signed)
Formatting of this note is different from the original.  Images from the original note were not included.  Patient ID:  Chris Hall is a 67 y.o. (DOB 1955/12/02) male.     Assessment     1. Encounter to establish care    2. White coat syndrome with diagnosis of hypertension    3. Benign prostatic hyperplasia, unspecified whether lower urinary tract symptoms present    4. Colloid cyst of brain (*)    5. Elevated PSA measurement    6. Recurrent UTI    7. Renal cancer, left (*)    8. Irritability and anger    9. Insomnia, unspecified type    10. Medication management      Plan   MRI Brain 08/2021 colloid cyst surveillance  Orders Placed This Encounter   Procedures    PSA, Total And Free    Urinalysis Complete w/ Rflx To Culture    Hemoglobin A1c    Lipid Panel With LDL/HDL Ratio    CMP14+CBC/D/Plt+TSH    Ambulatory referral to Urology       Patient's Medications          Accurate as of May 14, 2022  4:48 PM. Reflects encounter med changes as of last refresh             New Prescriptions        Instructions   FLUoxetine 20 mg capsule  Commonly known as: PROZAC  Started by: Bartholome Bill, NP   20 mg, Oral, Daily    temazepam 15 mg capsule  Commonly known as: RESTORIL  Started by: Bartholome Bill, NP   15 mg, Oral, At bedtime as needed            Risks, benefits, and alternatives of the medications and treatment plan prescribed today were discussed, and patient expressed understanding and agreement with the plan. Follow up in about 2 months (around 07/14/2022).    Subjective   Pt presents for routine follow up regarding chronic conditions as noted.    Patient presents with    Establish Care    Medicare Wellness Visit     Encounter to establish care    White coat syndrome with diagnosis of hypertension-Reported as controlled, patient endorses compliance with treatment regimen as prescribed and denies associated adverse effect.    Benign prostatic hyperplasia, unspecified whether lower urinary tract symptoms  present-This is chronic problem, starting over one year ago.  Patient does have nocturia and some daytime hesitancy, but symptoms are much better with treatment.  He does not have any significant lightheadedness, constipation, diarrhea or dry mouth on treatment.    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  Surveillance imaging recommended by Rad.  No associated symptoms.  Would only plan for intervention in the presence of symptoms.      Elevated PSA measurement -states persistent    Recurrent UTI states ongoing, reports 6-7 UTIs annually.  Question chronic prostatitis     Renal cancer, left (*)- s/p partial nephrectomy    Irritability and anger - reports uncontrolled.     Insomina-RAU, unresponsive to OTC preparations 2/2 residual lethargy     SOCIAL:  Past Medical History, Past Surgery History, Social History, and Family History were reviewed and updated.    No past surgical history on file.   No results found.   The patient has No Known Allergies.    General: no weight loss, fevers or night sweats.  Respiratory:  (-) DYSP w/ R or Exrt.  No cough  CV: (-) CP, PALP, ORTHOP or DYSP  GI: (-) ABD PN, (-) melena or hematochezia.  (-) HRT BRN.  (-) N/V  GU: no dysuria  or hematuria.    Musculoskeletal:  OCCAS-arthralgia/myalgia.  (-)JNT swelling  Neuro: (-)syncope.  (-) FOC WKNSS.  (-) paresthesias.    Psych:  + anger/irritable.  (-)anhedon/dysphor  All other systems reviewed and found to be negative    Objective     BP 142/86   Pulse 73   Temp 97.3 F (36.3 C)   Resp 16   Ht  (1.727 m)   Wt 247 lb 9.6 oz (112.3 kg)   SpO2 95%   BMI 37.65 kg/m   General:  Well developed, well nourished male, no distress  Head:  Normocephalic, atraumatic  Eyes:  PERRL, CONJ nml  Ears:   (B)EAC nml,  R-TM nml/L-TM nml  Nose:  Nares normal  Neck:  Supple. (-) APPRC-lymphadenopathy   Cardiovascular:  S1S2, RRR, (-) M/G/R  Lungs:  CTA-(B), normal effort  Abdomen:  BS actX4,  SFT,(-)TEND, (-)DISTEN  Skin:  No focal rashes   Extremities:  No clubbing, no cyanosis, no edema, Post Tib (P)2+  Neurologic:  (-)FOC DEF, CN II-XII intact  MS:  FROM x 4, and 5/5 STRN x 4  Electronically signed by Bartholome Bill, NP at 05/14/2022  4:49 PM EDT

## 2022-05-14 NOTE — Progress Notes (Signed)
Chris Hall has an order for XR CHEST (2 VW) and   CT ABDOMEN PELVIS W WO CONTRAST Additional Contrast? None     ALL MALE PATIENTS NEEDING AN MRI OF PELVIS OR PROSTATE, MUST BE SCHEDULED AT ONE OF THE FOLLOWING:    **MRI/CT (Preferred [facility])  **[facility] [place] (Preferred [facility])  **[facility] (Preferred [facility])  [facility]  [place]      To be done at [facility]    Needed by: Jul 24, 2022    Patient has a follow-up appointment:Yes Jul 24, 2022    If MRI, does patient have a pacemaker:   No    Order has been placed in connect care:   Yes    Is this a STAT order:  No    Tearra Tripplett

## 2022-05-21 ENCOUNTER — Encounter: Attending: Physician Assistant

## 2022-07-15 NOTE — Telephone Encounter (Signed)
Formatting of this note might be different from the original.  Last OV 12/30/21 reviewed; patient noted "no show" for two follow up appointments with Roxana Hires. Noted medication requested dispensed for 90 days from  [facility] in [place] on 07/09/22 ordered by Bertram Denver. Medication request declined, patient needs appointment with provider.   Electronically signed by Tonny Branch, RN at 07/15/2022  8:50 AM EDT

## 2022-07-17 NOTE — Progress Notes (Signed)
Formatting of this note is different from the original.  HPI: Chris Hall  is a very pleasant 67 y.o.M who is here to establish care. Patient has a history of BPH, recurrent infections, RCC and elevated PSA.     Patient has been taking Flomax, finds medication works well. Has a strong urinary stream. Denies urgency or frequency. He is asymptomatic of infections today. Estimates he has been getting an infection once every three months for the last eight years. He has had cystoscopy performed estimates last three years ago where nothing alarming was noted. Notes last DRE was ~ 4 months ago; normal.     Prior cultures Klebsiella    CT: 06/2022- A 3.3 cm low-attenuation right renal lesion likely represents a cyst. Small left renal lesions are too small to accurately characterize. No hydronephrosis. Decompressed urinary bladder. Prostate gland is normal in size.     Prior urological records on file; reviewed. 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  10/15/21-Klebsiella  09/11/21-Klebsiella     ROS:   Constitutional is negative for weight loss and night sweats  HEENT is negative for nosebleeds  Respiratory is negative for dyspnea  Endocrine is negative for cold intolerance and heat intolerance  GU is as above   Allergic/immunologic is negative for food allergies  Neurological is negative for stroke, TIA  Psychiatric/Behavior is negative for hallucinations, self injury  Past Medical History:   Diagnosis Date    Hypertension     Renal cell adenocarcinoma, left (*)      Past Surgical History:   Procedure Laterality Date    Nephrectomy Left     1/3 removed     Social History     Socioeconomic History    Marital status: Single   Tobacco Use    Smoking status: Former     Packs/day:  1.00     Years: 3.00     Additional pack years: 0.00     Total pack years: 3.00     Types: Cigarettes    Smokeless tobacco: Never   Substance and Sexual Activity    Drug use: Yes     Types: Marijuana     No family history on file.  Current Home Medications    Medication Sig Last Dose   albuterol sulfate HFA (PROVENTIL,VENTOLIN,PROAIR) 108 (90 Base) MCG/ACT inhaler Inhale two puffs into the lungs every 4 (four) hours as needed. Taking   FLUoxetine (PROZAC) 20 mg capsule Take one capsule (20 mg dose) by mouth daily. Taking   lisinopril (PRINIVIL,ZESTRIL) 10 mg tablet Take one tablet (10 mg dose) by mouth daily. Taking   tamsulosin (FLOMAX) 0.4 mg CAPS Take one capsule (0.4 mg dose) by mouth daily. Taking     No Known Allergies  PHYSICAL EXAMINATION:  VITAL SIGNS:   Vitals:    07/17/22 1124   BP: (!) 152/82   Pulse:    Resp:    Temp:    SpO2:      NEURO: Alert & oriented x3.  PSYCH: No apparent distress, normal mood and affect.  HEAD: NCAT. Extraocular muscles are intact. Anicteric. Pupils are equal, round.   HEART: Regular  rate. no edema lower extremities.  ABDOMEN: Soft, nontender, and nondistended.   GU:  PVR 4mL. DRE deferred  MUSCULOSKELETAL: Normal range of motion, no gait disturbance.     No results found for this or any previous visit (from the past 48 hour(s)).  Lab Results   Component Value Date    % Free PSA 13.7 05/14/2022    PSA, Free 0.67 05/14/2022    PSA 4.9 (H) 05/14/2022     UA: Positive nitrates,small leuks  PVR: 4mL    ASSESSMENT:     1. Abnormal urinalysis    2. Benign prostatic hyperplasia without lower urinary tract symptoms    3. Recurrent UTI    4. Elevated PSA    5. History of renal cell carcinoma      PSA values reviewed.   Most recent CT reviewed.   Urinalysis abnormal. To be sent for additional testing.    PLAN:   1. Drop off urine for any change in urinary symptoms - no appointment necessary  2. Recommend 64 oz of water daily for adequate hydration  3. Decrease sugary drinks  4. PSA  ordered for continued monitoring  5. Continue taking Flomax for BPH  6. Follow up 3 months or sooner if needed; consider Cystoscopy if you continue cycling infections  7. Urine sent for culture      Electronically signed by Jasmine Awe, MD at 07/17/2022 12:04 PM EDT    Associated attestation - Jasmine Awe, MD - 07/17/2022 12:04 PM EDT  Formatting of this note might be different from the original.  History reviewed with patient and nurse practitioner  History of partial nephrectomy.  CT normal.  History of urinary tract infection  Urine culture sent.  Contact patient if antibiotic therapy required  54-month follow-up  Drop off urine for any change in urinary symptoms - no appointment necessary     Camie Patience MD  [facility]  [phone]

## 2022-07-17 NOTE — Addendum Note (Signed)
Addended by: Helyn App on: 07/17/2022 01:50 PM     Modules accepted: Orders      Electronically signed by Helyn App, CMA at 07/17/2022  1:50 PM EDT

## 2022-07-23 NOTE — Progress Notes (Signed)
Formatting of this note is different from the original.  Images from the original note were not included.  Patient ID:  Chris Hall is a 67 y.o. (DOB [birth year]) male.     Assessment     1. Medicare annual wellness visit, subsequent    2. Benign prostatic hyperplasia, unspecified whether lower urinary tract symptoms present    3. Colloid cyst of brain (*)    4. Renal cancer, left (*)    5. Irritability and anger    6. Insomnia, unspecified type    7. Medication management    8. Screening for colon cancer      Plan   CT Brain 08/2021 colloid cyst surveillance, plan for Neuro-[facility] to repeat imaging 08/2022  Orders Placed This Encounter   Procedures    Ambulatory referral to Gastroenterology       Patient's Medications          Accurate as of Jul 23, 2022 10:39 AM. Reflects encounter med changes as of last refresh             New Prescriptions        Instructions   diazepam 5 mg tablet  Commonly known as: VALIUM   2.5-5 mg, Oral, 2 times a day as needed          Continued Medications        Instructions   albuterol sulfate HFA 108 (90 Base) MCG/ACT inhaler  Commonly known as: PROVENTIL,VENTOLIN,PROAIR   2 puffs, Inhalation, Every 4 hours as needed    lisinopril 10 mg tablet  Commonly known as: PRINIVIL,ZESTRIL   10 mg, Oral, Daily    tamsulosin 0.4 mg Caps  Commonly known as: FLOMAX   0.4 mg, Oral, Daily          Modified Medications        Instructions   amoxicillin-clavulanate 875-125 mg per tablet  Commonly known as: AUGMENTIN  What changed: Another medication with the same name was removed. Continue taking this medication, and follow the directions you see here.   1 tablet, Oral, 2 times a day    fluoxetine 40 MG capsule  Commonly known as: PROZAC  What changed:   medication strength  how much to take   40 mg, Oral, Daily            Risks, benefits, and alternatives of the medications and treatment plan prescribed today were discussed, and patient expressed understanding and agreement with the plan. Follow  up in about 3 months (around 10/23/2022).    Subjective   Pt presents for routine follow up regarding chronic conditions as noted.    Patient presents with    Medicare Wellness Visit     2 month follow up     Encounter to establish care    White coat syndrome with diagnosis of hypertension-Reported as controlled, patient endorses compliance with treatment regimen as prescribed and denies associated adverse effect.    Benign prostatic hyperplasia, unspecified whether lower urinary tract symptoms present-This is chronic problem, starting over one year ago.  Patient does have nocturia and some daytime hesitancy, but symptoms are much better with treatment.  He does not have any significant lightheadedness, constipation, diarrhea or dry mouth on treatment.    Colloid cyst of brain (*)-reports as stable, neurology-[facility] in [place], [place] following.  Surveillance imaging recommended by Rad.  No associated symptoms.  Would only plan for intervention in the presence of symptoms.      Elevated PSA measurement -  states persistent, [facility]rology-[facility]    Recurrent UTI states ongoing, reports 6-7 UTIs annually.  Question chronic prostatitis     Renal cancer, left (*)- s/p partial nephrectomy    Irritability and anger - reports uncontrolled, no benefit with fluoxetine also no adv eff.     Insomina-RAU, unresponsive to OTC preparations 2/2 residual lethargy.  Temazepam w/o noted benefit.  He recalls alternative was effective for him in the past when his mother's health was declining but he does not recall name of medication.  Record review reveals diazepam prescribed.       SOCIAL:  Past Medical History, Past Surgery History, Social History, and Family History were reviewed and updated.    Past Surgical History:   Procedure Laterality Date    Nephrectomy Left     1/3 removed     No results found.   The patient has No Known Allergies.    General: no weight loss, fevers or night sweats.  Respiratory:  (-) DYSP w/ R or Exrt.  No  cough  CV: (-) CP, PALP, ORTHOP or DYSP  GI: (-) ABD PN, (-) melena or hematochezia.  (-) HRT BRN.  (-) N/V  GU: no dysuria  or hematuria.    Musculoskeletal:  OCCAS-arthralgia/myalgia.  (-)JNT swelling  Neuro: (-)syncope.  (-) FOC WKNSS.  (-) paresthesias.    Psych:  + anger/irritable.  (-)anhedon/dysphor  All other systems reviewed and found to be negative    Objective     BP 134/82   Pulse 90   Temp 97.6 F (36.4 C)   Resp 16   Ht 5\' 8"  (1.727 m)   Wt 246 lb (111.6 kg)   SpO2 96%   BMI 37.40 kg/m   General:  Well developed, well nourished male, no distress  Head:  Normocephalic, atraumatic  Eyes:  PERRL, CONJ nml  Ears:   (B)EAC nml,  R-TM nml/L-TM nml  Nose:  Nares normal  Neck:  Supple. (-) APPRC-lymphadenopathy   Cardiovascular:  S1S2, RRR, (-) M/G/R  Lungs:  CTA-(B), normal effort  Abdomen:  BS actX4, SFT,(-)TEND, (-)DISTEN  Skin:  No focal rashes   Extremities:  No clubbing, no cyanosis, no edema, Post Tib (P)2+  Neurologic:  (-)FOC DEF, CN II-XII intact  MS:  FROM x 4, and 5/5 STRN x 4  Electronically signed by Bartholome Bill, NP at 07/23/2022 12:04 PM EDT

## 2022-07-23 NOTE — Progress Notes (Signed)
Formatting of this note is different from the original.    Medicare AWV   Chris Hall is a 67 y.o. male who presents for his .  Clinical documentation was reviewed and is accessible via encounter-level attachments.    Any physical exam components or additional concerns beyond the scope of the Annual Wellness Visit may be documented in a separate note within this encounter.    Medicare Required Components      Reviewed and updated this visit by provider:  None        Substance Use Disorder Risk Statement: Low Substance Use Disorder / Overdose risk - Risk factors were reviewed and patient is low risk     Patient Care Team:  Bartholome Bill, NP as PCP - General (Internal Medicine)    Vitals     Vitals:    07/23/22 1017   BP: 134/82   Pulse: 90   Temp: 97.6 F (36.4 C)   Resp: 16   Height: 5\' 8"  (1.727 m)   Weight: 246 lb (111.6 kg)   SpO2: 96%   BMI (Calculated): 37.4     Disposition     1. Medicare annual wellness visit, subsequent (Primary)  2. Benign prostatic hyperplasia, unspecified whether lower urinary tract symptoms present  3. Colloid cyst of brain (*)  4. Renal cancer, left (*)  5. Irritability and anger  6. Insomnia, unspecified type  7. Medication management  8. Screening for colon cancer  -     Ambulatory referral to Gastroenterology  Other orders  -     fluoxetine (PROZAC) 40 MG capsule; Take one capsule (40 mg dose) by mouth daily., Starting Wed 07/23/2022, Normal  -     diazepam (VALIUM) 5 mg tablet; Take one half tablet to one tablet (2.5-5 mg dose) by mouth 2 (two) times a day as needed for Anxiety or Sleep. Max Daily Amount: 10 mg, Starting Wed 07/23/2022, Normal    Follow up in about 3 months (around 10/23/2022).     Health maintenance issues were discussed with the patient.  A written plan was provided to the patient in the form of patient instructions in the After Visit Summary document.        Electronically signed by Bartholome Bill, NP at 07/23/2022 12:04 PM EDT

## 2022-07-24 ENCOUNTER — Encounter

## 2022-10-23 NOTE — Telephone Encounter (Signed)
ERROR
